# Patient Record
Sex: Female | Born: 1980 | Race: White | Hispanic: No | Marital: Married | Smoking: Never smoker
Health system: Southern US, Community
[De-identification: ages and names within clinical notes are randomized; demographics above are authoritative.]

## PROBLEM LIST (undated history)

## (undated) ENCOUNTER — Inpatient Hospital Stay (HOSPITAL_COMMUNITY): Payer: Self-pay

## (undated) DIAGNOSIS — T8859XA Other complications of anesthesia, initial encounter: Secondary | ICD-10-CM

## (undated) DIAGNOSIS — I10 Essential (primary) hypertension: Secondary | ICD-10-CM

## (undated) DIAGNOSIS — IMO0002 Reserved for concepts with insufficient information to code with codable children: Secondary | ICD-10-CM

## (undated) DIAGNOSIS — F32A Depression, unspecified: Secondary | ICD-10-CM

## (undated) DIAGNOSIS — M329 Systemic lupus erythematosus, unspecified: Secondary | ICD-10-CM

## (undated) DIAGNOSIS — T4145XA Adverse effect of unspecified anesthetic, initial encounter: Secondary | ICD-10-CM

## (undated) DIAGNOSIS — F329 Major depressive disorder, single episode, unspecified: Secondary | ICD-10-CM

## (undated) HISTORY — DX: Essential (primary) hypertension: I10

---
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
## 2000-07-04 ENCOUNTER — Other Ambulatory Visit: Admission: RE | Admit: 2000-07-04 | Discharge: 2000-07-04 | Payer: Self-pay | Admitting: Obstetrics and Gynecology

## 2001-08-05 ENCOUNTER — Other Ambulatory Visit: Admission: RE | Admit: 2001-08-05 | Discharge: 2001-08-05 | Payer: Self-pay | Admitting: Obstetrics and Gynecology

## 2001-08-24 ENCOUNTER — Emergency Department (HOSPITAL_COMMUNITY): Admission: EM | Admit: 2001-08-24 | Discharge: 2001-08-24 | Payer: Self-pay | Admitting: *Deleted

## 2002-08-08 ENCOUNTER — Other Ambulatory Visit: Admission: RE | Admit: 2002-08-08 | Discharge: 2002-08-08 | Payer: Self-pay | Admitting: Obstetrics and Gynecology

## 2003-08-26 ENCOUNTER — Other Ambulatory Visit: Admission: RE | Admit: 2003-08-26 | Discharge: 2003-08-26 | Payer: Self-pay | Admitting: Obstetrics and Gynecology

## 2004-08-30 ENCOUNTER — Other Ambulatory Visit: Admission: RE | Admit: 2004-08-30 | Discharge: 2004-08-30 | Payer: Self-pay | Admitting: Obstetrics and Gynecology

## 2008-04-13 ENCOUNTER — Inpatient Hospital Stay (HOSPITAL_COMMUNITY): Admission: AD | Admit: 2008-04-13 | Discharge: 2008-04-14 | Payer: Self-pay | Admitting: Obstetrics and Gynecology

## 2008-04-14 ENCOUNTER — Encounter (INDEPENDENT_AMBULATORY_CARE_PROVIDER_SITE_OTHER): Payer: Self-pay | Admitting: Obstetrics and Gynecology

## 2008-06-25 ENCOUNTER — Ambulatory Visit (HOSPITAL_COMMUNITY): Admission: RE | Admit: 2008-06-25 | Discharge: 2008-06-25 | Payer: Self-pay | Admitting: Obstetrics and Gynecology

## 2008-06-25 ENCOUNTER — Encounter (INDEPENDENT_AMBULATORY_CARE_PROVIDER_SITE_OTHER): Payer: Self-pay | Admitting: Obstetrics and Gynecology

## 2008-09-10 ENCOUNTER — Inpatient Hospital Stay (HOSPITAL_COMMUNITY): Admission: RE | Admit: 2008-09-10 | Discharge: 2008-09-10 | Payer: Self-pay | Admitting: Obstetrics and Gynecology

## 2009-04-02 ENCOUNTER — Inpatient Hospital Stay (HOSPITAL_COMMUNITY): Admission: AD | Admit: 2009-04-02 | Discharge: 2009-04-03 | Payer: Self-pay | Admitting: Obstetrics and Gynecology

## 2009-04-13 ENCOUNTER — Inpatient Hospital Stay (HOSPITAL_COMMUNITY): Admission: AD | Admit: 2009-04-13 | Discharge: 2009-04-13 | Payer: Self-pay | Admitting: Obstetrics and Gynecology

## 2009-04-19 ENCOUNTER — Inpatient Hospital Stay (HOSPITAL_COMMUNITY): Admission: RE | Admit: 2009-04-19 | Discharge: 2009-04-22 | Payer: Self-pay | Admitting: Obstetrics and Gynecology

## 2009-04-23 ENCOUNTER — Encounter: Admission: RE | Admit: 2009-04-23 | Discharge: 2009-05-20 | Payer: Self-pay | Admitting: Obstetrics and Gynecology

## 2010-05-02 LAB — CBC
HCT: 41.4 % (ref 36.0–46.0)
HCT: 42.9 % (ref 36.0–46.0)
Hemoglobin: 13.8 g/dL (ref 12.0–15.0)
MCHC: 33.2 g/dL (ref 30.0–36.0)
MCHC: 34.3 g/dL (ref 30.0–36.0)
MCV: 91.5 fL (ref 78.0–100.0)
MCV: 91.8 fL (ref 78.0–100.0)
Platelets: 104 10*3/uL — ABNORMAL LOW (ref 150–400)
Platelets: 164 10*3/uL (ref 150–400)
Platelets: 169 10*3/uL (ref 150–400)
RBC: 4.51 MIL/uL (ref 3.87–5.11)
RDW: 15.3 % (ref 11.5–15.5)
RDW: 15.4 % (ref 11.5–15.5)
RDW: 15.5 % (ref 11.5–15.5)
WBC: 8.5 10*3/uL (ref 4.0–10.5)

## 2010-05-02 LAB — COMPREHENSIVE METABOLIC PANEL
ALT: 30 U/L (ref 0–35)
AST: 30 U/L (ref 0–37)
Albumin: 2.7 g/dL — ABNORMAL LOW (ref 3.5–5.2)
Alkaline Phosphatase: 191 U/L — ABNORMAL HIGH (ref 39–117)
BUN: 9 mg/dL (ref 6–23)
CO2: 22 mEq/L (ref 19–32)
Calcium: 9 mg/dL (ref 8.4–10.5)
Chloride: 104 mEq/L (ref 96–112)
Creatinine, Ser: 0.74 mg/dL (ref 0.4–1.2)
GFR calc Af Amer: >60 mL/min (ref >60)
GFR calc non Af Amer: >60 mL/min (ref >60)
Glucose, Bld: 75 mg/dL (ref 70–99)
Potassium: 4.4 mEq/L (ref 3.5–5.1)
Sodium: 133 mEq/L — ABNORMAL LOW (ref 135–145)
Total Bilirubin: 0.4 mg/dL (ref 0.3–1.2)
Total Protein: 5.7 g/dL — ABNORMAL LOW (ref 6.0–8.3)

## 2010-05-14 LAB — URINALYSIS, ROUTINE W REFLEX MICROSCOPIC
Protein, ur: 30 mg/dL — AB
Urobilinogen, UA: 1 mg/dL (ref 0.0–1.0)

## 2010-05-14 LAB — URINE MICROSCOPIC-ADD ON

## 2010-05-14 LAB — URINE CULTURE: Colony Count: 60000

## 2010-05-17 LAB — HCG, SERUM, QUALITATIVE: Preg, Serum: NEGATIVE

## 2010-05-17 LAB — CBC
MCHC: 33.9 g/dL (ref 30.0–36.0)
RBC: 4.99 MIL/uL (ref 3.87–5.11)

## 2010-05-19 LAB — CBC
HCT: 37.6 % (ref 36.0–46.0)
MCHC: 33.8 g/dL (ref 30.0–36.0)
MCHC: 34 g/dL (ref 30.0–36.0)
Platelets: 204 10*3/uL (ref 150–400)
Platelets: 211 10*3/uL (ref 150–400)
RBC: 3.64 MIL/uL — ABNORMAL LOW (ref 3.87–5.11)
RDW: 13.6 % (ref 11.5–15.5)
RDW: 13.6 % (ref 11.5–15.5)
WBC: 18.5 10*3/uL — ABNORMAL HIGH (ref 4.0–10.5)

## 2010-05-19 LAB — RH IMMUNE GLOBULIN WORKUP (NOT WOMEN'S HOSP)
ABO/RH(D): A NEG
Antibody Screen: POSITIVE

## 2010-05-19 LAB — TYPE AND SCREEN: ABO/RH(D): A NEG

## 2010-06-21 NOTE — Op Note (Signed)
NAMEMarland Kitchen  ECHO, PROPP        ACCOUNT NO.:  [id number]   MEDICAL RECORD NO.:  [id number]          PATIENT TYPE:  AMB   LOCATION:  [facility]                           FACILITY:  [facility]   PHYSICIAN:  Juluis Mire, M.D.   DATE OF BIRTH:  02/29/1980   DATE OF PROCEDURE:  06/25/2008  DATE OF DISCHARGE:  06/25/2008                               OPERATIVE REPORT   PREOPERATIVE DIAGNOSIS:  Possible uterine septum or anomaly.   POSTOPERATIVE DIAGNOSIS:  There was no evidence of uterine septum or  anomaly.   PROCEDURE:  Hysteroscopy with dilation and curettage.   SURGEON:  Juluis Mire, MD   ANESTHESIA:  General.   ESTIMATED BLOOD LOSS:  Minimal.   PACKS AND DRAINS:  None.   INTRAOPERATIVE BLOOD PLACED:  None.   COMPLICATIONS:  None.   INDICATIONS:  As dictated in history and physical.   PROCEDURE:  The patient was taken to the OR, placed in supine position.  After satisfactory level of general endotracheal anesthesia was  obtained, the patient was placed in the dorsal lithotomy position using  the Allen stirrups.  The abdomen, perineum, vagina were prepped out with  Betadine and draped with sterile field.  Speculum was placed in the  vaginal vault.  Cervix grasped with single-tooth tenaculum.  A  paracervical blocks 1% Xylocaine was instituted.  Uterus sounded to 9  cm.  Cervix was dilated to a size 27 Pratt dilator.  A diagnostic  hysteroscope was introduced.  Intrauterine cavity was distended using  sorbitol.  Visualization revealed some thickened endometrium, but there  was absolutely no evidence of septum or any type of uterine anomalies.  When and put the resectoscope and to get better distention.  Visualization again revealed no evidence of any uterine anomalies.  We  did do endometrial curettings they were sent for pathology.  This point  in time, the hysteroscope was removed.  Total deficit was 200 mL.  Single-tooth speculum removed.  The patient taken out of dorsal  lithotomy position, once alert and extubated, transferred to recovery  room in good condition.  Sponge, instrument, and needle count was  correct by circulating nurse.     Juluis Mire, M.D.  Electronically Signed    JSM/MEDQ  D:  06/25/2008  T:  06/26/2008  Job:  [id number]

## 2010-06-21 NOTE — H&P (Signed)
NAME:  Kendra Jacobson, Kendra Jacobson        ACCOUNT NO.:  [id number]   MEDICAL RECORD NO.:  [id number]          PATIENT TYPE:  AMB   LOCATION:  [facility]C                           [facility]:  [facility]   PHYSICIAN:  Juluis Mire, M.D.   DATE OF BIRTH:  [birth year]   DATE OF ADMISSION:  06/25/2008  DATE OF DISCHARGE:                              HISTORY & PHYSICAL   The patient is a 30 year old gravida 1, para 0, abortus 2 female who  presents for hysteroscopy.   The patient had 15 weeks second trimester pregnancy loss.  Because of  this, we did some evaluation of the uterine cavity with saline infusion  ultrasound.  The question of uterine septum or arcuate uterus came into  play.  Because of this, we are going to proceed with hysteroscopic  evaluation and possible resection.  May want to consider laparoscopy at  the same time.   ALLERGIES:  In terms of allergies, the patient is allergic to  ERYTHROMYCIN.   MEDICATIONS:  Prenatal vitamins.   PAST MEDICAL HISTORY:  Usual childhood diseases.  No significant  sequelae.   PAST SURGICAL HISTORY:  She has had a tonsillectomy, also had a D&C with  this pregnancy loss.   SOCIAL HISTORY:  There is no tobacco or alcohol use.   FAMILY HISTORY:  There is a history of chronic hypertension.  Also a  history of deep venous thrombosis.  Her mother has autoimmune hepatitis.  Father with history of diabetes.   REVIEW OF SYSTEMS:  Noncontributory.   PHYSICAL EXAMINATION:  VITAL SIGNS:  The patient is afebrile with stable  vital signs.  HEENT:  The patient is normocephalic.  Pupils are equal, round, and  reactive to light and accommodation.  Extraocular movements are intact.  Sclerae and conjunctivae are clear.  Oropharynx clear.  NECK:  Without thyromegaly.  BREASTS:  No discrete masses.  LUNGS:  Clear.  CARDIOVASCULAR:  Regular rhythm and rate without murmurs or gallops.  ABDOMEN:  Benign.  No mass, organomegaly, or tenderness.  PELVIC:  Normal external  genitalia.  Vaginal mucosa is clear.  Cervix  unremarkable.  Uterus normal size, shape and contour.  Adnexa free of  masses or tenderness.  EXTREMITIES:  Trace edema.  NEUROLOGIC:  Grossly with normal limits.   IMPRESSION:  Second trimester pregnancy loss with possibility of uterine  septum.   PLAN:  The patient to undergo hysteroscopy for evaluation of septum.  The nature of the procedure have been discussed.  The risks explained  including the risk of infection.  The risk of hemorrhage that could  require transfusion  with the risk of AIDS or hepatitis, risk of injury to adjacent organs  including bladder, bowel or ureters could require further exploratory  surgery.  Risk of deep venous thrombosis and pulmonary embolus.  The  patient expressed understand potential risks and indications.      Juluis Mire, M.D.  Electronically Signed     JSM/MEDQ  D:  06/25/2008  T:  06/25/2008  Job:  [id number]

## 2010-06-21 NOTE — Op Note (Signed)
Kendra Jacobson, Kendra Jacobson        ACCOUNT NO.:  [id number]   MEDICAL RECORD NO.:  [id number]          PATIENT TYPE:  INP   LOCATION:  9151                          FACILITY:  [facility]   PHYSICIAN:  Duke Salvia. Marcelle Overlie, M.D.DATE OF BIRTH:  1981/01/10   DATE OF PROCEDURE:  04/14/2008  DATE OF DISCHARGE:                               OPERATIVE REPORT   PREOPERATIVE DIAGNOSIS:  A 15 and 1/2 week spontaneous abortion,  incomplete abortion.   POSTOPERATIVE DIAGNOSIS:  A 15 and 1/2 week spontaneous abortion,  incomplete abortion.   PROCEDURE:  Dilatation and curettage.   SURGEON:  Duke Salvia. Marcelle Overlie, MD   ANESTHESIA:  General.   COMPLICATIONS:  None.   DRAINS:  Foley catheter.   ESTIMATED BLOOD LOSS:  350 mL mainly old blood.   SPECIMENS REMOVED:  Products of conception, placenta.   PROCEDURE AND FINDINGS:  The patient taken to the operating room after  an adequate level of general anesthesia was obtained with the patient's  legs in stirrups.  Perineum and vagina prepped and draped with Betadine.  Foley catheter was already positioned.  Weighted speculum was  positioned.  The placental edge could be seen at the os, was grasped  with a ring forceps, and removed in one large section.  The anterior  cervical lip was then grasped with a ring forceps, a large curette was  then used to gently curette the cavity revealing some minor tissue  fragments, but the walls were cleaned at that point, minimal bleeding.  She tolerated this well, went to recovery room in good condition.      [name], M.D.  Electronically Signed     RMH/MEDQ  D:  04/14/2008  T:  04/14/2008  Job:  [id number]

## 2010-06-21 NOTE — H&P (Signed)
Kendra Jacobson, Kendra Jacobson        ACCOUNT NO.:  [id number]   MEDICAL RECORD NO.:  [id number]          PATIENT TYPE:  INP   LOCATION:                                [facility]ILITY:  [facility]   PHYSICIAN:  Duke Salvia. Marcelle Jacobson, M.D.DATE OF BIRTH:  November 25, 1980   DATE OF ADMISSION:  04/13/2008  DATE OF DISCHARGE:                              HISTORY & PHYSICAL   CHIEF COMPLAINT:  Spotting.  Pelvic pressure.   HISTORY OF PRESENT ILLNESS:  A 30 year old G1, P0, EGD October 04, 2008  currently 15 weeks.  This patient began to have some spotting 3 days  ago.  She was told to rest over the weekend and called the office this  morning.  Was brought in from ultrasound today complaining of still some  spotting and increased pelvic pressure.  Her first trimester screen and  AFP only returned normal.   Ultrasound today showed good fetal movement, [redacted] weeks gestation, a 3.0 x  12.8 x 2 cm [facility] with cervical length of 1 cm.  She is admitted at this  time for further observation and reevaluation with possible cerclage.   PAST MEDICAL HISTORY:   ALLERGIES:  ERYTHROMYCIN.   Please see her [facility] form for the remainder of her past medical  history.  Blood type is A negative.  She did receive a dose of RhoGAM  March 10, 2008 when she had some early pregnancy spotting.   PHYSICAL EXAM:  VITAL SIGNS:  Temperature 98.2, blood pressure 120/78,  afebrile.  HEENT:  Unremarkable.  NECK:  Supple without mass.  LUNGS:  Clear.  CARDIOVASCULAR:  Regular rate and rhythm without murmurs, rubs, or  gallops.  BREASTS:  Not examined.  ABDOMEN:  Benign.  Fundus is soft, nontender.  Fetal heart rate 140.  GENITOURINARY:  Cervix by palpation was closed.  Bulging lower uterine  segment.  No blood noted.   IMPRESSION:  1. A 15-week pregnancy.  2. Subchorionic hemorrhage.  3. Thin cervix, possible incompetent cervix.   PLAN:  Will admit for bed rest, evaluation, IV antibiotics, and repeat  ultrasound in the morning for  possible cerclage.      Kendra Jacobson, M.D.  Electronically Signed     RMH/MEDQ  D:  04/13/2008  T:  04/13/2008  Job:  [id number]

## 2011-10-30 ENCOUNTER — Other Ambulatory Visit: Payer: Self-pay | Admitting: Internal Medicine

## 2011-10-30 DIAGNOSIS — R748 Abnormal levels of other serum enzymes: Secondary | ICD-10-CM

## 2011-11-07 ENCOUNTER — Ambulatory Visit
Admission: RE | Admit: 2011-11-07 | Discharge: 2011-11-07 | Disposition: A | Discharge status: Home / Self Care | Payer: BC Managed Care – PPO | Source: Ambulatory Visit | Attending: Internal Medicine | Admitting: Internal Medicine

## 2011-11-07 DIAGNOSIS — R748 Abnormal levels of other serum enzymes: Secondary | ICD-10-CM

## 2012-07-17 ENCOUNTER — Ambulatory Visit: Payer: Self-pay | Admitting: Nurse Practitioner

## 2012-09-12 ENCOUNTER — Encounter (HOSPITAL_COMMUNITY): Payer: Self-pay | Admitting: Pharmacy Technician

## 2012-09-12 ENCOUNTER — Encounter (INDEPENDENT_AMBULATORY_CARE_PROVIDER_SITE_OTHER): Payer: Self-pay | Admitting: General Surgery

## 2012-09-12 ENCOUNTER — Ambulatory Visit (INDEPENDENT_AMBULATORY_CARE_PROVIDER_SITE_OTHER): Payer: BC Managed Care – PPO | Admitting: General Surgery

## 2012-09-12 VITALS — BP 118/62 | HR 89 | Temp 98.3°F | Resp 18 | Ht 65.0 in | Wt 162.0 lb

## 2012-09-12 DIAGNOSIS — IMO0001 Reserved for inherently not codable concepts without codable children: Secondary | ICD-10-CM

## 2012-09-12 NOTE — Progress Notes (Signed)
Patient ID: Kendra Jacobson, female   DOB: 10/01/1980, 32 y.o.   MRN: [id number]  Chief Complaint  Patient presents with  . New Evaluation    HPI Kendra Jacobson is a 32 y.o. female. This patient is referred by Dr. Beekman for possible muscle biopsy of her thigh. She has a history of lupus and is treated with hydroxy chloroquine for this but she has had ongoing lack of energy and muscle and joint weakness and myalgias. She's also had numbness in both of her hands with negative nerve studies. She has lack of energy and feels sore all over the place with migrating soreness. She says that is most always in her thighs bilaterally and will occasionally be in her arms as well. She was referred for muscle biopsy of her thigh due to 2 elevated CK levelswith her most recent CK level being 1233. She has taken prednisone off and on which seems to significantly improve her symptoms but she has been off of prednisone now for about 6 weeks and has continued symptoms. HPI  No past medical history on file.  Past Surgical History  Procedure Laterality Date  . Tonsillectomy  1999  . Cesarean section  04/2009  . Wisdom tooth extraction  2000    x4    Family History  Problem Relation Age of Onset  . Asthma Mother   . Hypertension Mother   . Parkinson's disease Father   . Hyperlipidemia Father     Social History History  Substance Use Topics  . Smoking status: Not on file  . Smokeless tobacco: Not on file  . Alcohol Use: Not on file    Allergies  Allergen Reactions  . Erythromycin     Current Outpatient Prescriptions  Medication Sig Dispense Refill  . DULoxetine (CYMBALTA) 60 MG capsule Take 60 mg by mouth daily.      . hydroxychloroquine (PLAQUENIL) 200 MG tablet Take by mouth daily.      . hydroxychloroquine (PLAQUENIL) 200 MG tablet Take by mouth daily.      . loratadine (CLARITIN) 10 MG tablet Take 10 mg by mouth daily.      . Prenatal Vit-Fe Fumarate-FA (PRENATAL  MULTIVITAMIN) TABS tablet Take 1 tablet by mouth daily at 12 noon.       No current facility-administered medications for this visit.    Review of Systems Review of Systems All other review of systems negative or noncontributory except as stated in the HPI  Blood pressure 118/62, pulse 89, temperature 98.3 F (36.8 C), resp. rate 18, height 5' 5" (1.651 m), weight 162 lb (73.483 kg).  Physical Exam Physical Exam Physical Exam  Nursing note and vitals reviewed. Constitutional: She is oriented to person, place, and time. She appears well-developed and well-nourished. No distress.  HENT:  Head: Normocephalic and atraumatic.  Mouth/Throat: No oropharyngeal exudate.  Eyes: Conjunctivae and EOM are normal. Pupils are equal, round, and reactive to light. Right eye exhibits no discharge. Left eye exhibits no discharge. No scleral icterus.  Neck: Normal range of motion. Neck supple. No tracheal deviation present.  Cardiovascular: Normal rate, regular rhythm, normal heart sounds and intact distal pulses.   Pulmonary/Chest: Effort normal and breath sounds normal. No stridor. No respiratory distress. She has no wheezes.  Abdominal: Soft. Bowel sounds are normal. She exhibits no distension and no mass. There is no tenderness. There is no rebound and no guarding.  Musculoskeletal: Normal range of motion. She exhibits no edema and no tenderness.  Neurological: She   is alert and oriented to person, place, and time.  Skin: Skin is warm and dry. No rash noted. She is not diaphoretic. No erythema. No pallor.  Psychiatric: She has a normal mood and affect. Her behavior is normal. Judgment and thought content normal.    Data Reviewed Labs and outpatient records  Assessment    Myalgias and myositis She has myalgias and lack of energy and is currently under going workup by her rheumatologist. He has recommended muscle biopsy to evaluate for possible cause of her elevated CK levels. I discussed with the  patient the option of thigh muscle biopsy as requested by her rheumatologist versus ongoing observation. She would like to proceed with muscle biopsy. We discussed the pros and cons of the procedure and the risks of the procedure including infection, bleeding, persistent symptoms, false positive or false-negative result, insufficient tissue, and nerve injury and pain and she states understanding and would like to undergo thigh muscle biopsy    Plan    We will set her up for muscle biopsy when convenient.        [name] DAVID 09/12/2012, 9:55 AM    

## 2012-09-13 ENCOUNTER — Telehealth (INDEPENDENT_AMBULATORY_CARE_PROVIDER_SITE_OTHER): Payer: Self-pay

## 2012-09-13 NOTE — Telephone Encounter (Signed)
Called Dr. Shawnee Knapp office [phone] to see which side the physician would like the patient to have a muscle biopsy performed by Dr. Biagio Quint.  Will await a call from Dr. Dierdre Forth or his nurse.

## 2012-09-13 NOTE — Telephone Encounter (Signed)
Incoming call from Dr. Shawnee Knapp office regarding muscle biopsy.  Per Dr. Dierdre Forth "No preference on sides, just quadricep muscle biopsy"  Message verbally given to Dr. Biagio Quint.

## 2012-09-13 NOTE — Patient Instructions (Addendum)
20 Kendra Jacobson  09/13/2012   Your procedure is scheduled on: 09/18/12  Report to [facility] at 6:00 AM  Call this number if you have problems the morning of surgery [phone]   Remember:   Do not eat food or drink liquids After Midnight.     Do not wear jewelry, make-up or nail polish.  Do not wear lotions, powders, or perfumes. You may wear deodorant.  Do not shave 48 hours prior to surgery. Men may shave face and neck.  Do not bring valuables to the hospital.  Contacts, dentures or bridgework may not be worn into surgery.     Patients discharged the day of surgery will not be allowed to drive home.  Name and phone number of your driver: [name] [phone]   Birdie Sons, RN  pre op nurse call if needed [phone]    FAILURE TO FOLLOW THESE INSTRUCTIONS MAY RESULT IN CANCELLATION OF YOUR SURGERY   Patient Signature: ___________________________________________

## 2012-09-16 ENCOUNTER — Encounter (HOSPITAL_COMMUNITY): Payer: Self-pay

## 2012-09-16 ENCOUNTER — Encounter (HOSPITAL_COMMUNITY)
Admission: RE | Admit: 2012-09-16 | Discharge: 2012-09-16 | Disposition: A | Discharge status: Home / Self Care | Payer: BC Managed Care – PPO | Source: Ambulatory Visit | Attending: General Surgery | Admitting: General Surgery

## 2012-09-16 VITALS — BP 113/76 | HR 82 | Temp 97.9°F | Resp 18 | Ht 64.0 in | Wt 162.0 lb

## 2012-09-16 DIAGNOSIS — IMO0001 Reserved for inherently not codable concepts without codable children: Secondary | ICD-10-CM

## 2012-09-16 HISTORY — DX: Other complications of anesthesia, initial encounter: T88.59XA

## 2012-09-16 HISTORY — DX: Adverse effect of unspecified anesthetic, initial encounter: T41.45XA

## 2012-09-16 HISTORY — DX: Reserved for concepts with insufficient information to code with codable children: IMO0002

## 2012-09-16 HISTORY — DX: Depression, unspecified: F32.A

## 2012-09-16 HISTORY — DX: Systemic lupus erythematosus, unspecified: M32.9

## 2012-09-16 HISTORY — DX: Major depressive disorder, single episode, unspecified: F32.9

## 2012-09-16 LAB — BASIC METABOLIC PANEL
BUN: 9 mg/dL (ref 6–23)
Chloride: 102 mEq/L (ref 96–112)
Creatinine, Ser: 0.6 mg/dL (ref 0.50–1.10)
Glucose, Bld: 96 mg/dL (ref 70–99)
Potassium: 3.9 mEq/L (ref 3.5–5.1)

## 2012-09-16 LAB — CBC
HCT: 41 % (ref 36.0–46.0)
Hemoglobin: 13.8 g/dL (ref 12.0–15.0)
MCH: 29.9 pg (ref 26.0–34.0)
MCHC: 33.7 g/dL (ref 30.0–36.0)
MCV: 88.9 fL (ref 78.0–100.0)

## 2012-09-17 NOTE — Anesthesia Preprocedure Evaluation (Addendum)
Anesthesia Evaluation  Patient identified by MRN, date of birth, ID band Patient awake    Reviewed: Allergy & Precautions, H&P , NPO status , Patient's Chart, lab work & pertinent test results  History of Anesthesia Complications Negative for: history of anesthetic complications  Airway Mallampati: II TM Distance: >3 FB Neck ROM: Full    Dental  (+) Dental Advisory Given and Teeth Intact   Pulmonary neg pulmonary ROS,          Cardiovascular negative cardio ROS      Neuro/Psych PSYCHIATRIC DISORDERS Depression negative neurological ROS     GI/Hepatic negative GI ROS, Neg liver ROS,   Endo/Other  negative endocrine ROS  Renal/GU negative Renal ROS     Musculoskeletal negative musculoskeletal ROS (+)   Abdominal   Peds  Hematology negative hematology ROS (+)   Anesthesia Other Findings   Reproductive/Obstetrics negative OB ROS                          Anesthesia Physical Anesthesia Plan  ASA: II  Anesthesia Plan: MAC and General   Post-op Pain Management:    Induction: Intravenous  Airway Management Planned: LMA, Mask and Simple Face Mask  Additional Equipment:   Intra-op Plan:   Post-operative Plan: Extubation in OR  Informed Consent: I have reviewed the patients History and Physical, chart, labs and discussed the procedure including the risks, benefits and alternatives for the proposed anesthesia with the patient or authorized representative who has indicated his/her understanding and acceptance.   Dental advisory given  Plan Discussed with: CRNA  Anesthesia Plan Comments:        Anesthesia Quick Evaluation

## 2012-09-18 ENCOUNTER — Encounter (HOSPITAL_COMMUNITY)
Admission: RE | Disposition: A | Discharge status: Home / Self Care | Payer: Self-pay | Source: Ambulatory Visit | Attending: General Surgery

## 2012-09-18 ENCOUNTER — Encounter (HOSPITAL_COMMUNITY): Payer: Self-pay | Admitting: Anesthesiology

## 2012-09-18 ENCOUNTER — Telehealth (INDEPENDENT_AMBULATORY_CARE_PROVIDER_SITE_OTHER): Payer: Self-pay

## 2012-09-18 ENCOUNTER — Encounter (HOSPITAL_COMMUNITY): Payer: Self-pay | Admitting: *Deleted

## 2012-09-18 ENCOUNTER — Ambulatory Visit (HOSPITAL_COMMUNITY): Payer: BC Managed Care – PPO | Admitting: Anesthesiology

## 2012-09-18 ENCOUNTER — Ambulatory Visit (HOSPITAL_COMMUNITY)
Admission: RE | Admit: 2012-09-18 | Discharge: 2012-09-18 | Disposition: A | Discharge status: Home / Self Care | Payer: BC Managed Care – PPO | Source: Ambulatory Visit | Attending: General Surgery | Admitting: General Surgery

## 2012-09-18 DIAGNOSIS — IMO0001 Reserved for inherently not codable concepts without codable children: Secondary | ICD-10-CM

## 2012-09-18 DIAGNOSIS — Z01812 Encounter for preprocedural laboratory examination: Secondary | ICD-10-CM | POA: Insufficient documentation

## 2012-09-18 DIAGNOSIS — R531 Weakness: Secondary | ICD-10-CM

## 2012-09-18 DIAGNOSIS — M329 Systemic lupus erythematosus, unspecified: Secondary | ICD-10-CM | POA: Insufficient documentation

## 2012-09-18 DIAGNOSIS — G7249 Other inflammatory and immune myopathies, not elsewhere classified: Secondary | ICD-10-CM | POA: Insufficient documentation

## 2012-09-18 DIAGNOSIS — Z79899 Other long term (current) drug therapy: Secondary | ICD-10-CM | POA: Insufficient documentation

## 2012-09-18 DIAGNOSIS — M6281 Muscle weakness (generalized): Secondary | ICD-10-CM

## 2012-09-18 HISTORY — PX: MUSCLE BIOPSY: SHX716

## 2012-09-18 SURGERY — MUSCLE BIOPSY
Anesthesia: Monitor Anesthesia Care | Site: Thigh | Laterality: Left | Wound class: Clean

## 2012-09-18 MED ORDER — PROMETHAZINE HCL 25 MG/ML IJ SOLN: 6.2500 mg | INTRAMUSCULAR | Status: DC | PRN

## 2012-09-18 MED ORDER — OXYCODONE HCL 5 MG/5ML PO SOLN
5.0000 mg | Freq: Once | ORAL | Status: DC | PRN
  Filled 2012-09-18: qty 5

## 2012-09-18 MED ORDER — CEFAZOLIN SODIUM-DEXTROSE 2-3 GM-% IV SOLR
INTRAVENOUS | Status: DC | PRN
  Administered 2012-09-18: 2 g via INTRAVENOUS

## 2012-09-18 MED ORDER — DEXAMETHASONE SODIUM PHOSPHATE 10 MG/ML IJ SOLN
INTRAMUSCULAR | Status: DC | PRN
  Administered 2012-09-18: 10 mg via INTRAVENOUS

## 2012-09-18 MED ORDER — LIDOCAINE-EPINEPHRINE (PF) 1 %-1:200000 IJ SOLN
INTRAMUSCULAR | Status: DC | PRN
  Administered 2012-09-18: 20 mL via INTRADERMAL

## 2012-09-18 MED ORDER — PROPOFOL INFUSION 10 MG/ML OPTIME
INTRAVENOUS | Status: DC | PRN
  Administered 2012-09-18: 75 ug/kg/min via INTRAVENOUS

## 2012-09-18 MED ORDER — OXYCODONE HCL 5 MG PO TABS: 5.0000 mg | ORAL_TABLET | Freq: Once | ORAL | Status: DC | PRN

## 2012-09-18 MED ORDER — HYDROCODONE-ACETAMINOPHEN 5-325 MG PO TABS: 1.0000 | ORAL_TABLET | ORAL | Status: DC | PRN

## 2012-09-18 MED ORDER — BUPIVACAINE HCL (PF) 0.25 % IJ SOLN
INTRAMUSCULAR | Status: AC
  Filled 2012-09-18: qty 30

## 2012-09-18 MED ORDER — MEPERIDINE HCL 50 MG/ML IJ SOLN: 6.2500 mg | INTRAMUSCULAR | Status: DC | PRN

## 2012-09-18 MED ORDER — ONDANSETRON HCL 4 MG/2ML IJ SOLN
INTRAMUSCULAR | Status: DC | PRN
  Administered 2012-09-18: 4 mg via INTRAVENOUS

## 2012-09-18 MED ORDER — LIDOCAINE-EPINEPHRINE 1 %-1:100000 IJ SOLN
INTRAMUSCULAR | Status: AC
  Filled 2012-09-18: qty 1

## 2012-09-18 MED ORDER — BUPIVACAINE HCL (PF) 0.25 % IJ SOLN
INTRAMUSCULAR | Status: DC | PRN
  Administered 2012-09-18: 20 mL

## 2012-09-18 MED ORDER — METOCLOPRAMIDE HCL 5 MG/ML IJ SOLN
INTRAMUSCULAR | Status: DC | PRN
  Administered 2012-09-18: 10 mg via INTRAVENOUS

## 2012-09-18 MED ORDER — FENTANYL CITRATE 0.05 MG/ML IJ SOLN
INTRAMUSCULAR | Status: DC | PRN
  Administered 2012-09-18 (×2): 50 ug via INTRAVENOUS

## 2012-09-18 MED ORDER — LACTATED RINGERS IV SOLN
INTRAVENOUS | Status: DC | PRN
  Administered 2012-09-18 (×2): via INTRAVENOUS

## 2012-09-18 MED ORDER — CEFAZOLIN SODIUM-DEXTROSE 2-3 GM-% IV SOLR
INTRAVENOUS | Status: AC
  Filled 2012-09-18: qty 50

## 2012-09-18 MED ORDER — HYDROMORPHONE HCL PF 1 MG/ML IJ SOLN: 0.2500 mg | INTRAMUSCULAR | Status: DC | PRN

## 2012-09-18 MED ORDER — LIDOCAINE HCL 1 % IJ SOLN
INTRAMUSCULAR | Status: DC | PRN
  Administered 2012-09-18: 10 mL via INTRADERMAL

## 2012-09-18 MED ORDER — LIDOCAINE HCL 1 % IJ SOLN
INTRAMUSCULAR | Status: AC
  Filled 2012-09-18: qty 40

## 2012-09-18 MED ORDER — CEFAZOLIN SODIUM-DEXTROSE 2-3 GM-% IV SOLR: 2.0000 g | INTRAVENOUS | Status: DC

## 2012-09-18 MED ORDER — KETAMINE HCL 10 MG/ML IJ SOLN
INTRAMUSCULAR | Status: DC | PRN
  Administered 2012-09-18 (×2): 10 mg via INTRAVENOUS

## 2012-09-18 MED ORDER — MIDAZOLAM HCL 5 MG/5ML IJ SOLN
INTRAMUSCULAR | Status: DC | PRN
  Administered 2012-09-18 (×2): 1 mg via INTRAVENOUS

## 2012-09-18 SURGICAL SUPPLY — 37 items
BENZOIN TINCTURE PRP APPL 2/3 (GAUZE/BANDAGES/DRESSINGS) IMPLANT
BLADE HEX COATED 2.75 (ELECTRODE) ×2 IMPLANT
CLOTH BEACON ORANGE TIMEOUT ST (SAFETY) ×2 IMPLANT
CONT SPECI 4OZ STER CLIK (MISCELLANEOUS) ×2 IMPLANT
DERMABOND ADVANCED (GAUZE/BANDAGES/DRESSINGS) ×1
DERMABOND ADVANCED .7 DNX12 (GAUZE/BANDAGES/DRESSINGS) ×1 IMPLANT
DISSECTOR ROUND CHERRY 3/8 STR (MISCELLANEOUS) IMPLANT
DRAPE PED LAPAROTOMY (DRAPES) ×2 IMPLANT
ELECT REM PT RETURN 9FT ADLT (ELECTROSURGICAL) ×2
ELECTRODE REM PT RTRN 9FT ADLT (ELECTROSURGICAL) ×1 IMPLANT
GLOVE BIO SURGEON STRL SZ7 (GLOVE) ×2 IMPLANT
GLOVE BIOGEL PI IND STRL 7.5 (GLOVE) ×1 IMPLANT
GLOVE BIOGEL PI INDICATOR 7.5 (GLOVE) ×1
GLOVE SURG SS PI 7.5 STRL IVOR (GLOVE) ×4 IMPLANT
GOWN PREVENTION PLUS LG XLONG (DISPOSABLE) ×2 IMPLANT
GOWN STRL REIN XL XLG (GOWN DISPOSABLE) ×4 IMPLANT
HEMOSTAT SURGICEL 2X14 (HEMOSTASIS) IMPLANT
KIT BASIN OR (CUSTOM PROCEDURE TRAY) ×2 IMPLANT
MARKER SKIN DUAL TIP RULER LAB (MISCELLANEOUS) ×2 IMPLANT
NEEDLE HYPO 22GX1.5 SAFETY (NEEDLE) ×2 IMPLANT
NS IRRIG 1000ML POUR BTL (IV SOLUTION) ×2 IMPLANT
PACK BASIC VI WITH GOWN DISP (CUSTOM PROCEDURE TRAY) ×2 IMPLANT
PENCIL BUTTON HOLSTER BLD 10FT (ELECTRODE) ×2 IMPLANT
SPONGE GAUZE 4X4 12PLY (GAUZE/BANDAGES/DRESSINGS) IMPLANT
SPONGE LAP 4X18 X RAY DECT (DISPOSABLE) ×4 IMPLANT
STAPLER VISISTAT 35W (STAPLE) IMPLANT
STRIP CLOSURE SKIN 1/2X4 (GAUZE/BANDAGES/DRESSINGS) IMPLANT
SUT MNCRL AB 4-0 PS2 18 (SUTURE) ×2 IMPLANT
SUT VIC AB 3-0 SH 27 (SUTURE) ×2
SUT VIC AB 3-0 SH 27XBRD (SUTURE) ×2 IMPLANT
SUT VICRYL 3 0 BR 18  UND (SUTURE) ×1
SUT VICRYL 3 0 BR 18 UND (SUTURE) ×1 IMPLANT
SYR BULB IRRIGATION 50ML (SYRINGE) IMPLANT
SYR CONTROL 10ML LL (SYRINGE) ×2 IMPLANT
TOWEL OR 17X26 10 PK STRL BLUE (TOWEL DISPOSABLE) ×2 IMPLANT
WATER STERILE IRR 1500ML POUR (IV SOLUTION) IMPLANT
YANKAUER SUCT BULB TIP 10FT TU (MISCELLANEOUS) ×2 IMPLANT

## 2012-09-18 NOTE — Op Note (Signed)
NAMEMarland Kitchen  KATHRIN, FOLDEN        ACCOUNT NO.:  [id number]  MEDICAL RECORD NO.:  [id number]  LOCATION:  [facility]                         FACILITY:  [facility]  PHYSICIAN:  Lodema Pilot, MD       DATE OF BIRTH:  09-01-1980  DATE OF PROCEDURE:  09/18/2012 DATE OF DISCHARGE:  09/18/2012                              OPERATIVE REPORT   PROCEDURE:  Left thigh muscle biopsy.  PREOPERATIVE DIAGNOSIS:  Weakness and myositis.  POSTOPERATIVE DIAGNOSIS:  Weakness and myositis.  SURGEON:  Lodema Pilot, MD  ASSISTANT:  None.  ANESTHESIA:  Local anesthesia with moderate sedation.  FLUIDS:  1 L of crystalloid.  ESTIMATED BLOOD LOSS:  Minimal.  DRAINS:  None.  SPECIMENS:  Left thigh muscle biopsy sent to Pathology fresh.  COMPLICATIONS:  None apparent.  FINDINGS:  Normal-appearing muscle biopsy from the rectus femoris muscle, anterior left thigh.  INDICATION FOR PROCEDURE:  Ms. Berkey is a 32 year old female with generalized weakness and fatigue, who is followed by Dr. Dierdre Forth and he has requested a thigh muscle biopsy for further evaluation of her symptoms.  OPERATIVE DETAILS:  Ms. Francena Hanly was seen and evaluated in the preoperative area.  Risks and benefits of the procedure were again discussed in lay terms.  Informed consent was obtained.  I marked the area over the quadriceps muscle on the left thigh and anticipated area of incision with the patient prior to anesthetic administration.  She was given prophylactic antibiotics and taken to the operating room and placed on the table in supine position.  Moderate sedation was obtained and the left thigh was prepped and draped in the standard surgical fashion.  The procedure time-out was performed with all operative team members to confirm proper patient and procedure and I anesthetized the area of the incision with 1% lidocaine without epinephrine and carried the dissection down to the subcutaneous tissue.  I minimized  Bovie electrocautery and we identified the fascia overlying the rectus femoris muscle.  I sharply incised the fascia and identified the underlying muscle body.  I elevated several strands of muscular fibers from the body of the muscle and placed a hemostat on each end of the tissue and sharply divided the muscle fibers between the hemostats and this chunk tissue was sent to Pathology on saline moistened gauze.  The ends of the remaining muscle fibers were ligated with 3-0 Vicryl suture x2 and another additional specimen was taken in similar fashion.  Pathology want [place] to send some tissue for fat analysis.  This was performed in similar fashion.  I attempted to obtain a 2 cm segment of tissue by 2 cm x 1 cm segment of tissue with each biopsy.  The muscle was hemostatic and after the specimens were sent to Pathology, I injected  the wound with 30 mL of 1% lidocaine with epinephrine at this time mixed with 0.25% Marcaine plain in a 50:50 mixture.  Again the epinephrine was not used until the specimen was sent.  Hemostasis was obtained with Bovie electrocautery and the fascia was loosely approximated with 3-0 Vicryl suture.  The wound was again noted to be hemostatic and the dermis was approximated with interrupted 3-0 Vicryl sutures and the skin edges were approximated with 4-0  Monocryl subcuticular suture.  Skin was washed and dried and Dermabond was applied.  All sponge, needle, and instrument counts were correct at end of the case.  The patient tolerated the procedure well without apparent complication.  After the procedure, I went to Pathology personally to ensure that they received the specimen to their satisfaction.          ______________________________ Lodema Pilot, MD     BL/MEDQ  D:  09/18/2012  T:  09/18/2012  Job:  [id number]

## 2012-09-18 NOTE — H&P (View-Only) (Signed)
Patient ID: Kendra Jacobson, female   DOB: January 29, 1981, 32 y.o.   MRN: [id number]  Chief Complaint  Patient presents with  . New Evaluation    HPI Kendra Jacobson is a 32 y.o. female. This patient is referred by Dr. Dierdre Forth for possible muscle biopsy of her thigh. She has a history of lupus and is treated with hydroxy chloroquine for this but she has had ongoing lack of energy and muscle and joint weakness and myalgias. She's also had numbness in both of her hands with negative nerve studies. She has lack of energy and feels sore all over the place with migrating soreness. She says that is most always in her thighs bilaterally and will occasionally be in her arms as well. She was referred for muscle biopsy of her thigh due to 2 elevated CK levelswith her most recent CK level being 1233. She has taken prednisone off and on which seems to significantly improve her symptoms but she has been off of prednisone now for about 6 weeks and has continued symptoms. HPI  No past medical history on file.  Past Surgical History  Procedure Laterality Date  . Tonsillectomy  1999  . Cesarean section  04/2009  . Wisdom tooth extraction  2000    x4    Family History  Problem Relation Age of Onset  . Asthma Mother   . Hypertension Mother   . Parkinson's disease Father   . Hyperlipidemia Father     Social History History  Substance Use Topics  . Smoking status: Not on file  . Smokeless tobacco: Not on file  . Alcohol Use: Not on file    Allergies  Allergen Reactions  . Erythromycin     Current Outpatient Prescriptions  Medication Sig Dispense Refill  . DULoxetine (CYMBALTA) 60 MG capsule Take 60 mg by mouth daily.      . hydroxychloroquine (PLAQUENIL) 200 MG tablet Take by mouth daily.      . hydroxychloroquine (PLAQUENIL) 200 MG tablet Take by mouth daily.      Marland Kitchen loratadine (CLARITIN) 10 MG tablet Take 10 mg by mouth daily.      . Prenatal Vit-Fe Fumarate-FA (PRENATAL  MULTIVITAMIN) TABS tablet Take 1 tablet by mouth daily at 12 noon.       No current facility-administered medications for this visit.    Review of Systems Review of Systems All other review of systems negative or noncontributory except as stated in the HPI  Blood pressure 118/62, pulse 89, temperature 98.3 F (36.8 C), resp. rate 18, height 5\' 5"  (1.651 m), weight 162 lb (73.483 kg).  Physical Exam Physical Exam Physical Exam  Nursing note and vitals reviewed. Constitutional: She is oriented to person, place, and time. She appears well-developed and well-nourished. No distress.  HENT:  Head: Normocephalic and atraumatic.  Mouth/Throat: No oropharyngeal exudate.  Eyes: Conjunctivae and EOM are normal. Pupils are equal, round, and reactive to light. Right eye exhibits no discharge. Left eye exhibits no discharge. No scleral icterus.  Neck: Normal range of motion. Neck supple. No tracheal deviation present.  Cardiovascular: Normal rate, regular rhythm, normal heart sounds and intact distal pulses.   Pulmonary/Chest: Effort normal and breath sounds normal. No stridor. No respiratory distress. She has no wheezes.  Abdominal: Soft. Bowel sounds are normal. She exhibits no distension and no mass. There is no tenderness. There is no rebound and no guarding.  Musculoskeletal: Normal range of motion. She exhibits no edema and no tenderness.  Neurological: She  is alert and oriented to person, place, and time.  Skin: Skin is warm and dry. No rash noted. She is not diaphoretic. No erythema. No pallor.  Psychiatric: She has a normal mood and affect. Her behavior is normal. Judgment and thought content normal.    Data Reviewed Labs and outpatient records  Assessment    Myalgias and myositis She has myalgias and lack of energy and is currently under going workup by her rheumatologist. He has recommended muscle biopsy to evaluate for possible cause of her elevated CK levels. I discussed with the  patient the option of thigh muscle biopsy as requested by her rheumatologist versus ongoing observation. She would like to proceed with muscle biopsy. We discussed the pros and cons of the procedure and the risks of the procedure including infection, bleeding, persistent symptoms, false positive or false-negative result, insufficient tissue, and nerve injury and pain and she states understanding and would like to undergo thigh muscle biopsy    Plan    We will set her up for muscle biopsy when convenient.        Lodema Pilot DAVID 09/12/2012, 9:55 AM

## 2012-09-18 NOTE — Transfer of Care (Signed)
Immediate Anesthesia Transfer of Care Note  Patient: Kendra Jacobson  Procedure(s) Performed: Procedure(s): MUSCLE BIOPSY (Left)  Patient Location: PACU  Anesthesia Type:MAC  Level of Consciousness: awake, alert , oriented, patient cooperative and responds to stimulation  Airway & Oxygen Therapy: Patient Spontanous Breathing and Patient connected to face mask oxygen  Post-op Assessment: Report given to PACU RN, Post -op Vital signs reviewed and stable and Patient moving all extremities X 4  Post vital signs: Reviewed and stable  Complications: No apparent anesthesia complications

## 2012-09-18 NOTE — Brief Op Note (Signed)
09/18/2012  9:34 AM  PATIENT:  Kendra Jacobson  32 y.o. female  PRE-OPERATIVE DIAGNOSIS:  MYOSITIS   POST-OPERATIVE DIAGNOSIS:  * No post-op diagnosis entered *  PROCEDURE:  Procedure(s): MUSCLE BIOPSY (Left)  SURGEON:  Surgeon(s) and Role:    * Lodema Pilot, DO - Primary  PHYSICIAN ASSISTANT:   ASSISTANTS: none   ANESTHESIA:   IV sedation  EBL:  Total I/O In: 1000 [I.V.:1000] Out: 10 [Blood:10]  BLOOD ADMINISTERED:none  DRAINS: none   LOCAL MEDICATIONS USED:  MARCAINE    and LIDOCAINE   SPECIMEN:  Source of Specimen:  left thigh muscle biopsy  DISPOSITION OF SPECIMEN:  PATHOLOGY  COUNTS:  YES  TOURNIQUET:  * No tourniquets in log *  DICTATION: .Other Dictation: Dictation Number dictated  PLAN OF CARE: Discharge to home after PACU  PATIENT DISPOSITION:  PACU - hemodynamically stable.   Delay start of Pharmacological VTE agent (>24hrs) due to surgical blood loss or risk of bleeding: no

## 2012-09-18 NOTE — Anesthesia Postprocedure Evaluation (Signed)
Anesthesia Post Note  Patient: Kendra Jacobson  Procedure(s) Performed: Procedure(s) (LRB): MUSCLE BIOPSY (Left)  Anesthesia type: MAC  Patient location: PACU  Post pain: Pain level controlled  Post assessment: Post-op Vital signs reviewed  Last Vitals:  Filed Vitals:   09/18/12 0945  BP: 107/61  Pulse: 73  Temp:   Resp: 13    Post vital signs: Reviewed  Level of consciousness: sedated  Complications: No apparent anesthesia complications

## 2012-09-18 NOTE — Interval H&P Note (Signed)
History and Physical Interval Note:  09/18/2012 8:14 AM  Harrell Gave  has presented today for surgery, with the diagnosis of MYOSITIS   The various methods of treatment have been discussed with the patient and family. After consideration of risks, benefits and other options for treatment, the patient has consented to  Procedure(s): MUSCLE BIOPSY (N/A) as a surgical intervention .  The patient's history has been reviewed, patient examined, no change in status, stable for surgery.  I have reviewed the patient's chart and labs.  Questions were answered to the patient's satisfaction.  I have seen and evaluated this patient.  She denies any changes from prior visit.  Dr. Dierdre Forth said that he has no preference to side of biopsy but would like it from the quadriceps.  I have again discussed the procedure and the risks including infection, bleeding, pain, scarring, false negative or false positive biopsy, insufficient tissue and nerve injury and she expressed understanding and desires to proceed with left thigh muscle biopsy.  Site marked prior to anesthesia with patient participation.   Lodema Pilot DAVID

## 2012-09-18 NOTE — Telephone Encounter (Signed)
Late entry-- Per pathology request, office notes from Dr. Biagio Quint & Dr. Dierdre Forth along with CPT Code (16109) faxed to pathology.  Confirmation rec'd for outgoing fax.

## 2012-09-18 NOTE — Preoperative (Signed)
Beta Blockers   Reason not to administer Beta Blockers:Not Applicable, not on home BB 

## 2012-09-19 ENCOUNTER — Encounter (HOSPITAL_COMMUNITY): Payer: Self-pay | Admitting: General Surgery

## 2012-10-02 ENCOUNTER — Encounter (INDEPENDENT_AMBULATORY_CARE_PROVIDER_SITE_OTHER): Payer: Self-pay | Admitting: General Surgery

## 2012-10-02 ENCOUNTER — Ambulatory Visit (INDEPENDENT_AMBULATORY_CARE_PROVIDER_SITE_OTHER): Payer: BC Managed Care – PPO | Admitting: General Surgery

## 2012-10-02 VITALS — BP 126/76 | HR 78 | Temp 97.6°F | Resp 16 | Ht 64.0 in | Wt 162.4 lb

## 2012-10-02 DIAGNOSIS — Z4889 Encounter for other specified surgical aftercare: Secondary | ICD-10-CM

## 2012-10-02 DIAGNOSIS — Z5189 Encounter for other specified aftercare: Secondary | ICD-10-CM

## 2012-10-02 NOTE — Progress Notes (Signed)
Subjective:     Patient ID: Kendra Jacobson, female   DOB: Feb 26, 1980, 32 y.o.   MRN: [id number]  HPI This patient follows up status post left thigh muscle biopsy.  She is doing very well and has no complaints. She said it was sore for about 4 days but is no longer having any discomfort. 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 She did have some bruising around her incision but this is in the resolving stages.    Assessment:     Status post left thigh muscle biopsy-doing well She's doing very well from her procedure and has no evidence of any postoperative complications. She has some bruising which is improving and I suspect that this will continue to improve. Her pathology was reviewed and consistent with a mild polymyositis or immune myositis.     Plan:     She is doing fine. She can follow up with me on a when necessary basis and follow up with her rheumatologist for further guidance and treatment of her myositis

## 2012-10-14 ENCOUNTER — Encounter (INDEPENDENT_AMBULATORY_CARE_PROVIDER_SITE_OTHER): Payer: Self-pay

## 2012-10-15 ENCOUNTER — Ambulatory Visit (INDEPENDENT_AMBULATORY_CARE_PROVIDER_SITE_OTHER): Payer: BC Managed Care – PPO | Admitting: General Surgery

## 2012-12-12 ENCOUNTER — Other Ambulatory Visit: Payer: Self-pay

## 2013-04-23 ENCOUNTER — Ambulatory Visit (INDEPENDENT_AMBULATORY_CARE_PROVIDER_SITE_OTHER): Payer: BC Managed Care – PPO | Admitting: Physician Assistant

## 2013-04-23 ENCOUNTER — Encounter: Payer: Self-pay | Admitting: Physician Assistant

## 2013-04-23 VITALS — BP 110/68 | HR 76 | Temp 98.3°F | Resp 18 | Ht 64.5 in | Wt 181.0 lb

## 2013-04-23 DIAGNOSIS — J988 Other specified respiratory disorders: Secondary | ICD-10-CM

## 2013-04-23 DIAGNOSIS — F329 Major depressive disorder, single episode, unspecified: Secondary | ICD-10-CM

## 2013-04-23 DIAGNOSIS — F32A Depression, unspecified: Secondary | ICD-10-CM

## 2013-04-23 DIAGNOSIS — F3289 Other specified depressive episodes: Secondary | ICD-10-CM

## 2013-04-23 DIAGNOSIS — A499 Bacterial infection, unspecified: Secondary | ICD-10-CM

## 2013-04-23 DIAGNOSIS — B9689 Other specified bacterial agents as the cause of diseases classified elsewhere: Secondary | ICD-10-CM

## 2013-04-23 DIAGNOSIS — M329 Systemic lupus erythematosus, unspecified: Secondary | ICD-10-CM

## 2013-04-23 MED ORDER — AZITHROMYCIN 250 MG PO TABS: ORAL_TABLET | ORAL | Status: DC

## 2013-04-23 NOTE — Progress Notes (Signed)
Patient ID: Kendra Jacobson MRN: [id number], DOB: 06/17/1980, 33 y.o. Date of Encounter: 04/23/2013, 4:14 PM    Chief Complaint:  Chief Complaint  Patient presents with  . sinus infection    sinus pain, green secretions     HPI: 33 y.o. year old white female says she started to get sick 4- 5 days ago. At the beginning she had some mild sore throat and irritated throat but that has resolved. Since onset she has been "blowing her nose constantly ". However says that the drainage from her nose had been clear until yesterday-- it became much thicker and green. As well she is having a cough that sounds like congestion in her chest. She has had no fever no ear ache no sore throat.     Home Meds: See attached medication section for any medications that were entered at today's visit. The computer does not put those onto this list.The following list is a list of meds entered prior to today's visit.   Current Outpatient Prescriptions on File Prior to Visit  Medication Sig Dispense Refill  . docusate sodium (COLACE) 100 MG capsule Take 100 mg by mouth daily.       . DULoxetine (CYMBALTA) 60 MG capsule Take 60 mg by mouth every evening.       . hydroxychloroquine (PLAQUENIL) 200 MG tablet Take 400 mg by mouth every evening.       . loratadine (CLARITIN) 10 MG tablet Take 20 mg by mouth daily.       . Prenatal Vit-Fe Fumarate-FA (PRENATAL MULTIVITAMIN) TABS tablet Take 1 tablet by mouth daily at 12 noon.       No current facility-administered medications on file prior to visit.    Allergies:  Allergies  Allergen Reactions  . Erythromycin Other (See Comments)    Upset stomach       Review of Systems: See HPI for pertinent ROS. All other ROS negative.    Physical Exam: Blood pressure 110/68, pulse 76, temperature 98.3 F (36.8 C), temperature source Oral, resp. rate 18, height 5' 4.5" (1.638 m), weight 181 lb (82.101 kg)., Body mass index is 30.6 kg/(m^2). General:  WF. Appears  in no acute distress. HEENT: Normocephalic, atraumatic, eyes without discharge, sclera non-icteric, nares are without discharge. Bilateral auditory canals clear, TM's are without perforation, pearly grey and translucent with reflective cone of light bilaterally. Oral cavity moist, posterior pharynx without exudate, erythema, peritonsillar abscess. No tenderness with percussion of frontal and maxillary sinuses bilaterally.  Neck: Supple. No thyromegaly. No lymphadenopathy. Lungs: Clear bilaterally to auscultation without wheezes, rales, or rhonchi. Breathing is unlabored. Heart: Regular rhythm. No murmurs, rubs, or gallops. Msk:  Strength and tone normal for age. Extremities/Skin: Warm and dry.  Neuro: Alert and oriented X 3. Moves all extremities spontaneously. Gait is normal. CNII-XII grossly in tact. Psych:  Responds to questions appropriately with a normal affect.     ASSESSMENT AND PLAN:  33 y.o. year old female with  1. Bacterial respiratory infection Allergy list includes " erythromycin-- GI upset". Patient says that even the erythromycin causes only very minimal GI upset and no significant problems. She says that she has taken the Z-Pak before and it has caused no adverse effects and has worked well for her. We'll prescribe this now. As well if she needs over-the-counter decongestants and cough medicines for symptom relief she can do this.  Followup if symptoms do not resolve within one week after completion of antibiotic. - azithromycin (ZITHROMAX)  250 MG tablet; Day 1: Take 2 daily.  Days 2-5: Take 1 daily.  Dispense: 6 tablet; Refill: 0  2. Lupus  3. Depression   Signed, Shon Hale Clarence, [place], [facility] 04/23/2013 4:14 PM

## 2013-06-16 ENCOUNTER — Ambulatory Visit (INDEPENDENT_AMBULATORY_CARE_PROVIDER_SITE_OTHER): Payer: BC Managed Care – PPO | Admitting: Family Medicine

## 2013-06-16 ENCOUNTER — Encounter: Payer: Self-pay | Admitting: Family Medicine

## 2013-06-16 VITALS — BP 104/62 | HR 80 | Temp 96.9°F | Resp 16 | Ht 64.0 in | Wt 184.0 lb

## 2013-06-16 DIAGNOSIS — J019 Acute sinusitis, unspecified: Secondary | ICD-10-CM

## 2013-06-16 MED ORDER — CEFDINIR 300 MG PO CAPS: 300.0000 mg | ORAL_CAPSULE | Freq: Two times a day (BID) | ORAL | Status: DC

## 2013-06-16 NOTE — Progress Notes (Signed)
Subjective:    Patient ID: Kendra Jacobson, female    DOB: 09/16/1980, 33 y.o.   MRN: [id number]  HPI Patient is a pleasant 33 year old white female who is on chronic immunosuppression for lupus. She's been to allergies her last month of constant clear rhinorrhea. Over the last few days she has developed subjective fevers and chills and constant pain and pressure in both maxillary sinuses. Also the rhinorrhea has become purulent.  Her teeth also ache. Past Medical History  Diagnosis Date  . Depression   . Lupus   . Complication of anesthesia     hallucinated with pain medicine after surgery   Current Outpatient Prescriptions on File Prior to Visit  Medication Sig Dispense Refill  . azithromycin (ZITHROMAX) 250 MG tablet Day 1: Take 2 daily.  Days 2-5: Take 1 daily.  6 tablet  0  . docusate sodium (COLACE) 100 MG capsule Take 100 mg by mouth daily.       . DULoxetine (CYMBALTA) 60 MG capsule Take 60 mg by mouth every evening.       . hydroxychloroquine (PLAQUENIL) 200 MG tablet Take 400 mg by mouth every evening.       . loratadine (CLARITIN) 10 MG tablet Take 20 mg by mouth daily.       . Prenatal Vit-Fe Fumarate-FA (PRENATAL MULTIVITAMIN) TABS tablet Take 1 tablet by mouth daily at 12 noon.       No current facility-administered medications on file prior to visit.   Allergies  Allergen Reactions  . Erythromycin Other (See Comments)    Upset stomach    History   Social History  . Marital Status: Married    Spouse Name: N/A    Number of Children: N/A  . Years of Education: N/A   Occupational History  . Not on file.   Social History Main Topics  . Smoking status: Never Smoker   . Smokeless tobacco: Never Used  . Alcohol Use: No  . Drug Use: No  . Sexual Activity: Not on file   Other Topics Concern  . Not on file   Social History Narrative  . No narrative on file      Review of Systems  All other systems reviewed and are negative.      Objective:   Physical Exam  Vitals reviewed. Constitutional: She appears well-developed and well-nourished.  HENT:  Right Ear: Tympanic membrane, external ear and ear canal normal.  Left Ear: Tympanic membrane, external ear and ear canal normal.  Nose: Mucosal edema and rhinorrhea present. Right sinus exhibits maxillary sinus tenderness. Right sinus exhibits no frontal sinus tenderness. Left sinus exhibits maxillary sinus tenderness. Left sinus exhibits no frontal sinus tenderness.  Mouth/Throat: Oropharynx is clear and moist. No oropharyngeal exudate.  Eyes: Conjunctivae are normal. No scleral icterus.  Neck: Neck supple.  Cardiovascular: Normal rate, regular rhythm and normal heart sounds.  Exam reveals no gallop and no friction rub.   No murmur heard. Pulmonary/Chest: Effort normal and breath sounds normal. No respiratory distress. She has no wheezes. She has no rales.  Abdominal: Soft. Bowel sounds are normal. She exhibits no distension. There is no tenderness. There is no rebound.  Lymphadenopathy:    She has no cervical adenopathy.          Assessment & Plan:  1. Acute rhinosinusitis I believe the patient has developed a secondary bacterial sinusitis after dealing with allergic sinusitis the last month.  I will treat the secondary bacterial infection [facility]  300 mg by mouth twice a day for 10 days and nasal saline 4 times a day. Once the infection has improved I recommended that she continue Nasonex and add Claritin 10 mg by mouth daily AS WELL.  She has been off Claritin for over a month and only using the nasal steroid spray. - cefdinir (OMNICEF) 300 MG capsule; Take 1 capsule (300 mg total) by mouth 2 (two) times daily.  Dispense: 20 capsule; Refill: 0

## 2013-11-05 ENCOUNTER — Ambulatory Visit (INDEPENDENT_AMBULATORY_CARE_PROVIDER_SITE_OTHER): Payer: 59 | Admitting: *Deleted

## 2013-11-05 DIAGNOSIS — Z111 Encounter for screening for respiratory tuberculosis: Secondary | ICD-10-CM

## 2013-11-05 NOTE — Patient Instructions (Signed)
Return on Friday, 11/07/2013 to have TB test read.

## 2013-11-05 NOTE — Progress Notes (Signed)
Patient ID: Kendra Jacobson, female   DOB: 11/20/1980, 33 y.o.   MRN: [id number] Patient seen in office for TB Skin Test.   Tolerated administration well.   Will return in 48 hours to have read.

## 2013-11-07 ENCOUNTER — Ambulatory Visit: Payer: 59 | Admitting: Family Medicine

## 2013-11-07 DIAGNOSIS — Z111 Encounter for screening for respiratory tuberculosis: Secondary | ICD-10-CM

## 2013-11-07 LAB — TB SKIN TEST
Induration: 0 mm
TB Skin Test: NEGATIVE

## 2013-11-07 NOTE — Progress Notes (Signed)
Patient ID: Kendra Jacobson, female   DOB: 12/26/1980, 33 y.o.   MRN: [id number] Here for reading of TB skin test.  Test is negative.  Pt given copy of result.

## 2013-11-20 ENCOUNTER — Other Ambulatory Visit: Payer: Self-pay | Admitting: Obstetrics and Gynecology

## 2013-11-21 LAB — CYTOLOGY - PAP

## 2013-12-31 ENCOUNTER — Ambulatory Visit (INDEPENDENT_AMBULATORY_CARE_PROVIDER_SITE_OTHER): Payer: 59 | Admitting: Family Medicine

## 2013-12-31 ENCOUNTER — Encounter: Payer: Self-pay | Admitting: Family Medicine

## 2013-12-31 VITALS — BP 110/58 | HR 62 | Temp 98.1°F | Resp 14 | Ht 64.0 in | Wt 192.0 lb

## 2013-12-31 DIAGNOSIS — J069 Acute upper respiratory infection, unspecified: Secondary | ICD-10-CM

## 2013-12-31 DIAGNOSIS — J0100 Acute maxillary sinusitis, unspecified: Secondary | ICD-10-CM

## 2013-12-31 MED ORDER — GUAIFENESIN-CODEINE 100-10 MG/5ML PO SOLN: 5.0000 mL | Freq: Four times a day (QID) | ORAL | Status: DC | PRN

## 2013-12-31 MED ORDER — AMOXICILLIN-POT CLAVULANATE 875-125 MG PO TABS: 1.0000 | ORAL_TABLET | Freq: Two times a day (BID) | ORAL | Status: DC

## 2013-12-31 NOTE — Progress Notes (Signed)
Patient ID: Kendra Jacobson, female   DOB: 07/17/1980, 33 y.o.   MRN: [id number]   Subjective:    Patient ID: Kendra Jacobson, female    DOB: 01/22/1981, 33 y.o.   MRN: [id number]  Patient presents for Illness  patient here with sinus pressure transient initially clear no colored worsening over the past week she's also had a paced nasal drip and some cough which is nonproductive but feels very achy in her chest feels like she can't take a deep breath. She has a very remote history of exercise-induced asthma but has not required any medications for many years. She's not had any significant fever though she is immunocompromised and she has lupus. On prednisone 5 mg once a day    Review Of Systems:  GEN- denies +, fever, weight loss,weakness, recent illness HEENT- denies eye drainage, change in vision, +nasal discharge, CVS- denies chest pain, palpitations RESP- denies SOB, +cough, wheeze ABD- denies N/V, change in stools, abd pain GU- denies dysuria, hematuria, dribbling, incontinence MSK- denies joint pain, muscle aches, injury Neuro- denies headache, dizziness, syncope, seizure activity       Objective:    BP 110/58 mmHg  Pulse 62  Temp(Src) 98.1 F (36.7 C) (Oral)  Resp 14  Ht 5\' 4"  (1.626 m)  Wt 192 lb (87.091 kg)  BMI 32.94 kg/m2  LMP 12/30/2013 (Approximate) GEN- NAD, alert and oriented x3 HEENT- PERRL, EOMI, non injected sclera, pink conjunctiva, MMM, oropharynx mild injection, TM clear bilat no effusion,  + maxillary sinus tenderness, inflammed turbinates,  Nasal drainage  Neck- Supple, no LAD CVS- RRR, no murmur RESP-CTAB EXT- No edema Pulses- Radial 2+         Assessment & Plan:      Problem List Items Addressed This Visit    None    Visit Diagnoses    Acute maxillary sinusitis, recurrence not specified    -  Primary    worsening symptonms, treat with augmentin, nasocort,, will give robitussin AC for soreness with URI component, pt  immunocompromised    Relevant Medications       AMOXICILLIN-POT CLAVULANATE 875-125 MG PO TABS       Guaifenesin-codeine 100-10 mg/465mL po sol    Acute URI        Relevant Medications       azaTHIOprine (IMURAN) 50 MG tablet       Note: This dictation was prepared with Dragon dictation along with smaller phrase technology. Any transcriptional errors that result from this process are unintentional.

## 2014-01-14 ENCOUNTER — Ambulatory Visit (INDEPENDENT_AMBULATORY_CARE_PROVIDER_SITE_OTHER): Payer: 59 | Admitting: Family Medicine

## 2014-01-14 ENCOUNTER — Encounter: Payer: Self-pay | Admitting: Family Medicine

## 2014-01-14 VITALS — BP 120/70 | HR 76 | Temp 97.6°F | Resp 14 | Ht 63.0 in | Wt 193.0 lb

## 2014-01-14 DIAGNOSIS — N3000 Acute cystitis without hematuria: Secondary | ICD-10-CM

## 2014-01-14 DIAGNOSIS — R300 Dysuria: Secondary | ICD-10-CM

## 2014-01-14 LAB — URINALYSIS, ROUTINE W REFLEX MICROSCOPIC
BILIRUBIN URINE: NEGATIVE
Glucose, UA: NEGATIVE mg/dL
Hgb urine dipstick: NEGATIVE
KETONES UR: NEGATIVE mg/dL
NITRITE: POSITIVE — AB
PH: 6 (ref 5.0–8.0)
Urobilinogen, UA: 0.2 mg/dL (ref 0.0–1.0)

## 2014-01-14 LAB — URINALYSIS, MICROSCOPIC ONLY
CASTS: NONE SEEN
CRYSTALS: NONE SEEN
RBC / HPF: NONE SEEN RBC/hpf (ref <3)
WBC, UA: >50 WBC/hpf — AB (ref <3)

## 2014-01-14 MED ORDER — NITROFURANTOIN MONOHYD MACRO 100 MG PO CAPS: 100.0000 mg | ORAL_CAPSULE | Freq: Two times a day (BID) | ORAL | Status: DC

## 2014-01-14 MED ORDER — PHENAZOPYRIDINE HCL 100 MG PO TABS: 100.0000 mg | ORAL_TABLET | Freq: Three times a day (TID) | ORAL | Status: DC | PRN

## 2014-01-14 NOTE — Patient Instructions (Signed)
Take macrobid as prescribed Pyrdium for the pressure Plenty of water, cranberry juice F/U as needed

## 2014-01-14 NOTE — Progress Notes (Signed)
   Subjective:    Patient ID: Kendra Jacobson, female    DOB: 05/19/1980, 33 y.o.   MRN: [id number]  Patient presents for UTI  patient here with dysuria and urinary frequency and dribbling for the past 24 hours. She does have history of urinary tract infections in the past would happen after intercourse. She did have increased intercourse past couple weeks or they are trying for a child. She's not had any vaginal discharge no fever. Her previous upper respiratory/ sinus infection has cleared up.    Review Of Systems:  GEN- denies fatigue, fever, weight loss,weakness, recent illness HEENT- denies eye drainage, change in vision, nasal discharge, CVS- denies chest pain, palpitations RESP- denies SOB, cough, wheeze ABD- denies N/V, change in stools, abd pain GU- + dysuria, hematuria, dribbling, incontinence MSK- denies joint pain, muscle aches, injury Neuro- denies headache, dizziness, syncope, seizure activity       Objective:    BP 120/70 mmHg  Pulse 76  Temp(Src) 97.6 F (36.4 C) (Oral)  Resp 14  Ht 5\' 3"  (1.6 m)  Wt 193 lb (87.544 kg)  BMI 34.20 kg/m2  LMP 12/30/2013 (Approximate) GEN- NAD, alert and oriented x3 ABD-NABS,soft,+ suprapubic pressure/tenderness, no CVA tenderness EXT- No edema Pulses- Radial, DP- 2+        Assessment & Plan:      Problem List Items Addressed This Visit    None    Visit Diagnoses    Acute cystitis without hematuria    -  Primary    Treat with macrobid, pyridium, if she has recurrent infections, would dose after intercourse     Burning with urination        Relevant Orders       Urinalysis, Routine w reflex microscopic (Completed)       Note: This dictation was prepared with Dragon dictation along with smaller phrase technology. Any transcriptional errors that result from this process are unintentional.

## 2014-02-12 ENCOUNTER — Ambulatory Visit (INDEPENDENT_AMBULATORY_CARE_PROVIDER_SITE_OTHER): Payer: 59 | Admitting: Family Medicine

## 2014-02-12 ENCOUNTER — Encounter: Payer: Self-pay | Admitting: Family Medicine

## 2014-02-12 LAB — COMPLETE METABOLIC PANEL WITH GFR
ALK PHOS: 64 U/L (ref 39–117)
ALT: 19 U/L (ref 0–35)
AST: 18 U/L (ref 0–37)
Albumin: 4.3 g/dL (ref 3.5–5.2)
BUN: 11 mg/dL (ref 6–23)
CALCIUM: 9.4 mg/dL (ref 8.4–10.5)
CHLORIDE: 103 meq/L (ref 96–112)
CO2: 24 mEq/L (ref 19–32)
CREATININE: 0.69 mg/dL (ref 0.50–1.10)
GFR, Est African American: >89 mL/min
GFR, Est Non African American: >89 mL/min
Glucose, Bld: 76 mg/dL (ref 70–99)
Potassium: 4.1 mEq/L (ref 3.5–5.3)
Sodium: 139 mEq/L (ref 135–145)
Total Bilirubin: 0.5 mg/dL (ref 0.2–1.2)
Total Protein: 7.1 g/dL (ref 6.0–8.3)

## 2014-02-12 LAB — TSH: TSH: 2.32 u[IU]/mL (ref 0.350–4.500)

## 2014-02-12 LAB — HEMOGLOBIN A1C
HEMOGLOBIN A1C: 5 % (ref <5.7)
MEAN PLASMA GLUCOSE: 97 mg/dL (ref <117)

## 2014-02-12 LAB — LIPID PANEL
CHOL/HDL RATIO: 2.9 ratio
Cholesterol: 200 mg/dL (ref 0–200)
HDL: 70 mg/dL (ref >39)
LDL CALC: 119 mg/dL — AB (ref 0–99)
TRIGLYCERIDES: 56 mg/dL (ref <150)
VLDL: 11 mg/dL (ref 0–40)

## 2014-02-12 NOTE — Progress Notes (Signed)
Subjective:    Patient ID: Kendra Jacobson, female    DOB: 11/13/1980, 34 y.o.   MRN: [id number]  HPI  Patient is a very pleasant 34 year old white female who unfortunately has lupus. She is on several immunosuppressive including prednisone. Due to her medication she has a difficult time losing weight. Currently she is 5 foot 4 inches and weighs 194 pounds. She is tried diet and exercise with very little success. She has a very strong family history of heart disease and diabetes and is therefore requesting a fasting lipid panel and hemoglobin A1c today. She is also concerned that her thyroid may be underactive making it harder for her to lose weight. She is tried exercising for 5 days a week using cardio as well as weight resistance training and eating a healthy diet and has still failed to lose weight and therefore would like to check her thyroid. She does have fatigue and constipation. She denies any palpitations. Her and her husband are trying to conceive a child. She is interested in medications for weight loss. Past Medical History  Diagnosis Date  . Depression   . Lupus   . Complication of anesthesia     hallucinated with pain medicine after surgery   Past Surgical History  Procedure Laterality Date  . Tonsillectomy  1999  . Cesarean section  04/2009  . Wisdom tooth extraction  2000    x4  . Muscle biopsy Left 09/18/2012    Procedure: MUSCLE BIOPSY;  Surgeon: Lodema PilotBrian Layton, DO;  Location: [facility];  Service: General;  Laterality: Left;   Current Outpatient Prescriptions on File Prior to Visit  Medication Sig Dispense Refill  . azaTHIOprine (IMURAN) 50 MG tablet   3  . docusate sodium (COLACE) 100 MG capsule Take 100 mg by mouth daily.     . hydroxychloroquine (PLAQUENIL) 200 MG tablet Take 400 mg by mouth every evening.     . phenazopyridine (PYRIDIUM) 100 MG tablet Take 1 tablet (100 mg total) by mouth 3 (three) times daily as needed for pain. 10 tablet 0  . predniSONE  (DELTASONE) 5 MG tablet Take 5 mg by mouth daily with breakfast.    . Prenatal Vit-Fe Fumarate-FA (PRENATAL MULTIVITAMIN) TABS tablet Take 1 tablet by mouth daily at 12 noon.     No current facility-administered medications on file prior to visit.   Allergies  Allergen Reactions  . Erythromycin Other (See Comments)    Upset stomach    History   Social History  . Marital Status: Married    Spouse Name: N/A    Number of Children: N/A  . Years of Education: N/A   Occupational History  . Not on file.   Social History Main Topics  . Smoking status: Never Smoker   . Smokeless tobacco: Never Used  . Alcohol Use: No  . Drug Use: No  . Sexual Activity: Not on file   Other Topics Concern  . Not on file   Social History Narrative     Review of Systems  All other systems reviewed and are negative.      Objective:   Physical Exam  Neck: No thyromegaly present.  Cardiovascular: Normal rate, regular rhythm and normal heart sounds.   Pulmonary/Chest: Effort normal and breath sounds normal. No respiratory distress. She has no wheezes. She has no rales.  Abdominal: Soft. Bowel sounds are normal.  Vitals reviewed.         Assessment & Plan:  Morbid obesity - Plan: COMPLETE  METABOLIC PANEL WITH GFR, Hemoglobin A1c, Lipid panel, TSH  We discussed therapeutic lifestyle changes including a 1200-calorie a day diet and 30 minutes to an hour of aerobic exercise for 5 days a week. I would not recommend any medication for weight loss while she is trying to become pregnant. Afterward, Adipex would be her safest short-term option.  I will also check a CBC, CMP, fasting lipid panel, and TSH.

## 2014-05-10 ENCOUNTER — Inpatient Hospital Stay (HOSPITAL_COMMUNITY)
Admission: AD | Admit: 2014-05-10 | Discharge: 2014-05-10 | Disposition: A | Discharge status: Home / Self Care | Payer: 59 | Source: Ambulatory Visit | Attending: Obstetrics and Gynecology | Admitting: Obstetrics and Gynecology

## 2014-05-10 ENCOUNTER — Encounter (HOSPITAL_COMMUNITY): Payer: Self-pay | Admitting: *Deleted

## 2014-05-10 DIAGNOSIS — O99612 Diseases of the digestive system complicating pregnancy, second trimester: Secondary | ICD-10-CM | POA: Diagnosis not present

## 2014-05-10 DIAGNOSIS — K529 Noninfective gastroenteritis and colitis, unspecified: Secondary | ICD-10-CM | POA: Diagnosis not present

## 2014-05-10 DIAGNOSIS — O210 Mild hyperemesis gravidarum: Secondary | ICD-10-CM | POA: Diagnosis present

## 2014-05-10 DIAGNOSIS — Z3A15 15 weeks gestation of pregnancy: Secondary | ICD-10-CM | POA: Diagnosis not present

## 2014-05-10 LAB — URINE MICROSCOPIC-ADD ON

## 2014-05-10 LAB — URINALYSIS, ROUTINE W REFLEX MICROSCOPIC
BILIRUBIN URINE: NEGATIVE
GLUCOSE, UA: NEGATIVE mg/dL
HGB URINE DIPSTICK: NEGATIVE
Ketones, ur: 15 mg/dL — AB
NITRITE: NEGATIVE
PH: 6 (ref 5.0–8.0)
PROTEIN: 30 mg/dL — AB
Urobilinogen, UA: 0.2 mg/dL (ref 0.0–1.0)

## 2014-05-10 LAB — POCT PREGNANCY, URINE: Preg Test, Ur: POSITIVE — AB

## 2014-05-10 MED ORDER — ONDANSETRON HCL 4 MG PO TABS: 4.0000 mg | ORAL_TABLET | Freq: Four times a day (QID) | ORAL | Status: DC

## 2014-05-10 MED ORDER — ONDANSETRON HCL 4 MG/2ML IJ SOLN
4.0000 mg | Freq: Once | INTRAMUSCULAR | Status: AC
  Administered 2014-05-10: 4 mg via INTRAVENOUS
  Filled 2014-05-10: qty 2

## 2014-05-10 MED ORDER — PROMETHAZINE HCL 25 MG PO TABS: 25.0000 mg | ORAL_TABLET | Freq: Four times a day (QID) | ORAL | Status: DC | PRN

## 2014-05-10 MED ORDER — PROMETHAZINE HCL 25 MG/ML IJ SOLN
12.5000 mg | Freq: Once | INTRAMUSCULAR | Status: AC
  Administered 2014-05-10: 12.5 mg via INTRAVENOUS
  Filled 2014-05-10: qty 1

## 2014-05-10 MED ORDER — DEXTROSE 5 % IN LACTATED RINGERS IV BOLUS
1000.0000 mL | Freq: Once | INTRAVENOUS | Status: AC
  Administered 2014-05-10: 1000 mL via INTRAVENOUS

## 2014-05-10 NOTE — MAU Note (Signed)
Pt states that she feels better after zofran, but that phenergan does not help at all.

## 2014-05-10 NOTE — MAU Provider Note (Signed)
History     CSN: [id number]  Arrival date and time: 05/10/14 1639   First Provider Initiated Contact with Patient 05/10/14 1736      Chief Complaint  Patient presents with  . Emesis  . Diarrhea   HPI 34 y.o. G3P1011 at [redacted] weeks EGA w/ nausea, vomiting, diarrhea starting today, 2 family members sick with same symptoms. No fever, + chills, no pain, no vaginal bleeding.   Past Medical History  Diagnosis Date  . Depression   . Lupus   . Complication of anesthesia     hallucinated with pain medicine after surgery    Past Surgical History  Procedure Laterality Date  . Tonsillectomy  1999  . Cesarean section  04/2009  . Wisdom tooth extraction  2000    x4  . Muscle biopsy Left 09/18/2012    Procedure: MUSCLE BIOPSY;  Surgeon: Lodema Pilot, DO;  Location: [facility];  Service: General;  Laterality: Left;    Family History  Problem Relation Age of Onset  . Asthma Mother   . Hypertension Mother   . Parkinson's disease Father   . Hyperlipidemia Father     History  Substance Use Topics  . Smoking status: Never Smoker   . Smokeless tobacco: Never Used  . Alcohol Use: No    Allergies:  Allergies  Allergen Reactions  . Erythromycin Other (See Comments)    Upset stomach     Prescriptions prior to admission  Medication Sig Dispense Refill Last Dose  . azaTHIOprine (IMURAN) 50 MG tablet Take 50-100 mg by mouth daily. 100 mg AM, 50 mg PM  3 05/09/2014 at Unknown time  . doxylamine, Sleep, (UNISOM) 25 MG tablet Take 25 mg by mouth at bedtime as needed (nausea).   05/09/2014 at Unknown time  . hydroxychloroquine (PLAQUENIL) 200 MG tablet Take 400 mg by mouth every evening.    05/09/2014 at Unknown time  . predniSONE (DELTASONE) 5 MG tablet Take 5 mg by mouth daily with breakfast.   05/09/2014 at Unknown time  . Prenatal Vit-Fe Fumarate-FA (PRENATAL MULTIVITAMIN) TABS tablet Take 1 tablet by mouth daily at 12 noon.   05/09/2014 at Unknown time  . PYRIDOXINE HCL PO Take 1 tablet by mouth at  bedtime as needed (nausea).   05/09/2014 at Unknown time  . phenazopyridine (PYRIDIUM) 100 MG tablet Take 1 tablet (100 mg total) by mouth 3 (three) times daily as needed for pain. (Patient not taking: Reported on 05/10/2014) 10 tablet 0 Taking    Review of Systems  Constitutional: Positive for chills. Negative for fever.  Respiratory: Negative.   Cardiovascular: Negative.   Gastrointestinal: Positive for nausea, vomiting and diarrhea. Negative for abdominal pain and constipation.  Genitourinary: Negative for dysuria, urgency, frequency, hematuria and flank pain.       Negative for vaginal bleeding, cramping/contractions  Musculoskeletal: Negative.   Neurological: Negative.   Psychiatric/Behavioral: Negative.    Physical Exam   Last menstrual period 12/30/2013.  Physical Exam  Nursing note and vitals reviewed. Constitutional: She is oriented to person, place, and time. She appears well-developed and well-nourished. No distress.  Cardiovascular: Normal rate.   Respiratory: Effort normal.  GI: Soft. She exhibits no distension and no mass. There is no tenderness. There is no rebound and no guarding.  Musculoskeletal: Normal range of motion.  Neurological: She is alert and oriented to person, place, and time.  Skin: Skin is warm and dry.  Psychiatric: She has a normal mood and affect.   + FHR MAU  Course  Procedures Results for orders placed or performed during the hospital encounter of 05/10/14 (from the past 24 hour(s))  Urinalysis, Routine w reflex microscopic     Status: Abnormal   Collection Time: 05/10/14  4:39 PM  Result Value Ref Range   Color, Urine YELLOW YELLOW   APPearance CLEAR CLEAR   Specific Gravity, Urine >1.030 (H) 1.005 - 1.030   pH 6.0 5.0 - 8.0   Glucose, UA NEGATIVE NEGATIVE mg/dL   Hgb urine dipstick NEGATIVE NEGATIVE   Bilirubin Urine NEGATIVE NEGATIVE   Ketones, ur 15 (A) NEGATIVE mg/dL   Protein, ur 30 (A) NEGATIVE mg/dL   Urobilinogen, UA 0.2 0.0 - 1.0  mg/dL   Nitrite NEGATIVE NEGATIVE   Leukocytes, UA TRACE (A) NEGATIVE  Urine microscopic-add on     Status: None   Collection Time: 05/10/14  4:39 PM  Result Value Ref Range   Squamous Epithelial / LPF RARE RARE   WBC, UA 0-2 <3 WBC/hpf   Bacteria, UA RARE RARE  Pregnancy, urine POC     Status: Abnormal   Collection Time: 05/10/14  5:03 PM  Result Value Ref Range   Preg Test, Ur POSITIVE (A) NEGATIVE   IV hydration w/ D5LR, Phenergan 12.5 mg IV - initially doing well after phenergan, no vomiting or diarrhea while in MAU, then reports increased nausea, Zofran 4mg  IV ordered, nausea improved.  Assessment and Plan   1. Gastroenteritis, acute   Rx phenergan and Zofran, f/u if not improved or worsening, otherwise f/u at next prenatal visit.     Medication List    STOP taking these medications        phenazopyridine 100 MG tablet  Commonly known as:  PYRIDIUM      TAKE these medications        azaTHIOprine 50 MG tablet  Commonly known as:  IMURAN  Take 50-100 mg by mouth daily. 100 mg AM, 50 mg PM     doxylamine (Sleep) 25 MG tablet  Commonly known as:  UNISOM  Take 25 mg by mouth at bedtime as needed (nausea).     hydroxychloroquine 200 MG tablet  Commonly known as:  PLAQUENIL  Take 400 mg by mouth every evening.     ondansetron 4 MG tablet  Commonly known as:  ZOFRAN  Take 1 tablet (4 mg total) by mouth every 6 (six) hours.     predniSONE 5 MG tablet  Commonly known as:  DELTASONE  Take 5 mg by mouth daily with breakfast.     prenatal multivitamin Tabs tablet  Take 1 tablet by mouth daily at 12 noon.     promethazine 25 MG tablet  Commonly known as:  PHENERGAN  Take 1 tablet (25 mg total) by mouth every 6 (six) hours as needed for nausea or vomiting.     PYRIDOXINE HCL PO  Take 1 tablet by mouth at bedtime as needed (nausea).        Follow-up Information    Follow up with Meriel PicaHOLLAND,RICHARD M, MD.   Specialty:  Obstetrics and Gynecology   Why:  as scheduled  or sooner as needed   Contact information:   [address] [place] [address] [phone]         [facility] LLCFRAZIER,Rollo Farquhar 05/10/2014, 9:09 PM

## 2014-05-10 NOTE — MAU Note (Signed)
Pt stats that she has had vomiting and diarrhea since this morning.  She says she just started feeling bad today.  Denies LOF.VB.

## 2014-10-03 ENCOUNTER — Encounter (HOSPITAL_COMMUNITY)
Admission: AD | Disposition: A | Discharge status: Home / Self Care | Payer: Self-pay | Source: Ambulatory Visit | Attending: Obstetrics and Gynecology

## 2014-10-03 ENCOUNTER — Inpatient Hospital Stay (HOSPITAL_COMMUNITY): Payer: 59 | Admitting: Anesthesiology

## 2014-10-03 ENCOUNTER — Inpatient Hospital Stay (HOSPITAL_COMMUNITY)
Admission: AD | Admit: 2014-10-03 | Discharge: 2014-10-06 | DRG: 766 | Disposition: A | Discharge status: Home / Self Care | Payer: 59 | Source: Ambulatory Visit | Attending: Obstetrics and Gynecology | Admitting: Obstetrics and Gynecology

## 2014-10-03 ENCOUNTER — Encounter (HOSPITAL_COMMUNITY): Payer: Self-pay

## 2014-10-03 DIAGNOSIS — O3421 Maternal care for scar from previous cesarean delivery: Principal | ICD-10-CM | POA: Diagnosis present

## 2014-10-03 DIAGNOSIS — Z6835 Body mass index (BMI) 35.0-35.9, adult: Secondary | ICD-10-CM | POA: Diagnosis not present

## 2014-10-03 DIAGNOSIS — O99344 Other mental disorders complicating childbirth: Secondary | ICD-10-CM | POA: Diagnosis present

## 2014-10-03 DIAGNOSIS — E669 Obesity, unspecified: Secondary | ICD-10-CM | POA: Diagnosis present

## 2014-10-03 DIAGNOSIS — F419 Anxiety disorder, unspecified: Secondary | ICD-10-CM | POA: Diagnosis present

## 2014-10-03 DIAGNOSIS — Z98891 History of uterine scar from previous surgery: Secondary | ICD-10-CM

## 2014-10-03 DIAGNOSIS — F329 Major depressive disorder, single episode, unspecified: Secondary | ICD-10-CM | POA: Diagnosis present

## 2014-10-03 DIAGNOSIS — M329 Systemic lupus erythematosus, unspecified: Secondary | ICD-10-CM | POA: Diagnosis present

## 2014-10-03 DIAGNOSIS — Z3A35 35 weeks gestation of pregnancy: Secondary | ICD-10-CM | POA: Diagnosis present

## 2014-10-03 DIAGNOSIS — O99214 Obesity complicating childbirth: Secondary | ICD-10-CM | POA: Diagnosis present

## 2014-10-03 LAB — CBC
HEMATOCRIT: 39.4 % (ref 36.0–46.0)
Hemoglobin: 13.6 g/dL (ref 12.0–15.0)
MCH: 32.4 pg (ref 26.0–34.0)
MCHC: 34.5 g/dL (ref 30.0–36.0)
MCV: 93.8 fL (ref 78.0–100.0)
Platelets: 226 10*3/uL (ref 150–400)
RBC: 4.2 MIL/uL (ref 3.87–5.11)
RDW: 13.3 % (ref 11.5–15.5)
WBC: 9.3 10*3/uL (ref 4.0–10.5)

## 2014-10-03 LAB — URINALYSIS, ROUTINE W REFLEX MICROSCOPIC
Bilirubin Urine: NEGATIVE
GLUCOSE, UA: NEGATIVE mg/dL
Hgb urine dipstick: NEGATIVE
Ketones, ur: 15 mg/dL — AB
LEUKOCYTES UA: NEGATIVE
NITRITE: NEGATIVE
PH: 7 (ref 5.0–8.0)
Protein, ur: NEGATIVE mg/dL
SPECIFIC GRAVITY, URINE: 1.015 (ref 1.005–1.030)
Urobilinogen, UA: 0.2 mg/dL (ref 0.0–1.0)

## 2014-10-03 LAB — RPR: RPR Ser Ql: NONREACTIVE

## 2014-10-03 SURGERY — Surgical Case
Anesthesia: Spinal

## 2014-10-03 MED ORDER — NALBUPHINE HCL 10 MG/ML IJ SOLN: 5.0000 mg | INTRAMUSCULAR | Status: DC | PRN

## 2014-10-03 MED ORDER — SIMETHICONE 80 MG PO CHEW
80.0000 mg | CHEWABLE_TABLET | ORAL | Status: DC
Start: 2014-10-04 — End: 2014-10-06
  Administered 2014-10-04 – 2014-10-05 (×3): 80 mg via ORAL
  Filled 2014-10-03 (×3): qty 1

## 2014-10-03 MED ORDER — LACTATED RINGERS IV SOLN
INTRAVENOUS | Status: DC | PRN
  Administered 2014-10-03: 08:00:00 via INTRAVENOUS

## 2014-10-03 MED ORDER — FAMOTIDINE IN NACL 20-0.9 MG/50ML-% IV SOLN
20.0000 mg | Freq: Once | INTRAVENOUS | Status: AC
  Administered 2014-10-03: 20 mg via INTRAVENOUS
  Filled 2014-10-03: qty 50

## 2014-10-03 MED ORDER — DIPHENHYDRAMINE HCL 50 MG/ML IJ SOLN: 12.5000 mg | INTRAMUSCULAR | Status: DC | PRN

## 2014-10-03 MED ORDER — KETOROLAC TROMETHAMINE 30 MG/ML IJ SOLN
30.0000 mg | Freq: Four times a day (QID) | INTRAMUSCULAR | Status: AC | PRN
  Administered 2014-10-03: 30 mg via INTRAMUSCULAR

## 2014-10-03 MED ORDER — DIPHENHYDRAMINE HCL 25 MG PO CAPS
25.0000 mg | ORAL_CAPSULE | Freq: Four times a day (QID) | ORAL | Status: DC | PRN
Start: 2014-10-03 — End: 2014-10-06

## 2014-10-03 MED ORDER — AZATHIOPRINE 50 MG PO TABS
100.0000 mg | ORAL_TABLET | Freq: Every day | ORAL | Status: DC
  Administered 2014-10-03 – 2014-10-05 (×3): 100 mg via ORAL
  Filled 2014-10-03 (×3): qty 2

## 2014-10-03 MED ORDER — MORPHINE SULFATE 0.5 MG/ML IJ SOLN
INTRAMUSCULAR | Status: AC
  Filled 2014-10-03: qty 100

## 2014-10-03 MED ORDER — OXYCODONE-ACETAMINOPHEN 5-325 MG PO TABS
2.0000 | ORAL_TABLET | ORAL | Status: DC | PRN
Start: 2014-10-03 — End: 2014-10-06
  Administered 2014-10-04 – 2014-10-05 (×4): 2 via ORAL
  Filled 2014-10-03 (×4): qty 2

## 2014-10-03 MED ORDER — SENNOSIDES-DOCUSATE SODIUM 8.6-50 MG PO TABS
2.0000 | ORAL_TABLET | ORAL | Status: DC
  Administered 2014-10-04: 2 via ORAL
  Filled 2014-10-03 (×3): qty 2

## 2014-10-03 MED ORDER — FENTANYL CITRATE (PF) 100 MCG/2ML IJ SOLN
INTRAMUSCULAR | Status: DC | PRN
  Administered 2014-10-03: 10 ug via INTRATHECAL

## 2014-10-03 MED ORDER — SODIUM CHLORIDE 0.9 % IR SOLN
Status: DC | PRN
  Administered 2014-10-03: 1000 mL

## 2014-10-03 MED ORDER — ONDANSETRON HCL 4 MG/2ML IJ SOLN
INTRAMUSCULAR | Status: AC
  Filled 2014-10-03: qty 2

## 2014-10-03 MED ORDER — SODIUM CHLORIDE 0.9 % IJ SOLN: 3.0000 mL | INTRAMUSCULAR | Status: DC | PRN

## 2014-10-03 MED ORDER — WITCH HAZEL-GLYCERIN EX PADS: 1.0000 "application " | MEDICATED_PAD | CUTANEOUS | Status: DC | PRN

## 2014-10-03 MED ORDER — LACTATED RINGERS IV SOLN
INTRAVENOUS | Status: DC
  Administered 2014-10-03: 1 mL via INTRAVENOUS
  Administered 2014-10-04: 01:00:00 via INTRAVENOUS

## 2014-10-03 MED ORDER — ONDANSETRON HCL 4 MG/2ML IJ SOLN: 4.0000 mg | Freq: Three times a day (TID) | INTRAMUSCULAR | Status: DC | PRN

## 2014-10-03 MED ORDER — CITRIC ACID-SODIUM CITRATE 334-500 MG/5ML PO SOLN
30.0000 mL | Freq: Once | ORAL | Status: AC
  Administered 2014-10-03: 30 mL via ORAL
  Filled 2014-10-03: qty 15

## 2014-10-03 MED ORDER — PRENATAL MULTIVITAMIN CH
1.0000 | ORAL_TABLET | Freq: Every day | ORAL | Status: DC
Start: 2014-10-03 — End: 2014-10-06
  Administered 2014-10-04 – 2014-10-06 (×3): 1 via ORAL
  Filled 2014-10-03 (×3): qty 1

## 2014-10-03 MED ORDER — LACTATED RINGERS IV SOLN: INTRAVENOUS | Status: DC

## 2014-10-03 MED ORDER — BUPIVACAINE IN DEXTROSE 0.75-8.25 % IT SOLN
INTRATHECAL | Status: DC | PRN
  Administered 2014-10-03: 1.6 mL via INTRATHECAL

## 2014-10-03 MED ORDER — SIMETHICONE 80 MG PO CHEW: 80.0000 mg | CHEWABLE_TABLET | ORAL | Status: DC | PRN

## 2014-10-03 MED ORDER — MORPHINE SULFATE (PF) 0.5 MG/ML IJ SOLN
INTRAMUSCULAR | Status: DC | PRN
  Administered 2014-10-03: .2 mg via INTRATHECAL

## 2014-10-03 MED ORDER — FENTANYL CITRATE (PF) 100 MCG/2ML IJ SOLN
INTRAMUSCULAR | Status: AC
  Filled 2014-10-03: qty 4

## 2014-10-03 MED ORDER — ACETAMINOPHEN 325 MG PO TABS: 650.0000 mg | ORAL_TABLET | ORAL | Status: DC | PRN

## 2014-10-03 MED ORDER — OXYTOCIN 40 UNITS IN LACTATED RINGERS INFUSION - SIMPLE MED
62.5000 mL/h | INTRAVENOUS | Status: AC
Start: 2014-10-03 — End: 2014-10-04

## 2014-10-03 MED ORDER — FENTANYL CITRATE (PF) 100 MCG/2ML IJ SOLN: 25.0000 ug | INTRAMUSCULAR | Status: DC | PRN

## 2014-10-03 MED ORDER — CEFAZOLIN SODIUM-DEXTROSE 2-3 GM-% IV SOLR
2.0000 g | INTRAVENOUS | Status: AC
  Administered 2014-10-03: 2 g via INTRAVENOUS
  Filled 2014-10-03: qty 50

## 2014-10-03 MED ORDER — NALOXONE HCL 1 MG/ML IJ SOLN
1.0000 ug/kg/h | INTRAVENOUS | Status: DC | PRN
  Filled 2014-10-03: qty 2

## 2014-10-03 MED ORDER — SIMETHICONE 80 MG PO CHEW
80.0000 mg | CHEWABLE_TABLET | Freq: Three times a day (TID) | ORAL | Status: DC
  Administered 2014-10-03 – 2014-10-06 (×8): 80 mg via ORAL
  Filled 2014-10-03 (×8): qty 1

## 2014-10-03 MED ORDER — OXYTOCIN 10 UNIT/ML IJ SOLN
40.0000 [IU] | INTRAVENOUS | Status: DC | PRN
  Administered 2014-10-03: 40 [IU] via INTRAVENOUS

## 2014-10-03 MED ORDER — FAMOTIDINE IN NACL 20-0.9 MG/50ML-% IV SOLN
INTRAVENOUS | Status: AC
  Filled 2014-10-03: qty 50

## 2014-10-03 MED ORDER — PHENYLEPHRINE 8 MG IN D5W 100 ML (0.08MG/ML) PREMIX OPTIME
INJECTION | INTRAVENOUS | Status: AC
  Filled 2014-10-03: qty 100

## 2014-10-03 MED ORDER — NALOXONE HCL 0.4 MG/ML IJ SOLN: 0.4000 mg | INTRAMUSCULAR | Status: DC | PRN

## 2014-10-03 MED ORDER — LANOLIN HYDROUS EX OINT: 1.0000 "application " | TOPICAL_OINTMENT | CUTANEOUS | Status: DC | PRN

## 2014-10-03 MED ORDER — PHENYLEPHRINE 8 MG IN D5W 100 ML (0.08MG/ML) PREMIX OPTIME
INJECTION | INTRAVENOUS | Status: DC | PRN
  Administered 2014-10-03: 60 ug/min via INTRAVENOUS

## 2014-10-03 MED ORDER — PREDNISONE 5 MG PO TABS
5.0000 mg | ORAL_TABLET | Freq: Every day | ORAL | Status: DC
  Administered 2014-10-04 – 2014-10-06 (×3): 5 mg via ORAL
  Filled 2014-10-03 (×3): qty 1

## 2014-10-03 MED ORDER — LACTATED RINGERS IV SOLN
INTRAVENOUS | Status: DC
  Administered 2014-10-03 (×3): via INTRAVENOUS

## 2014-10-03 MED ORDER — KETOROLAC TROMETHAMINE 30 MG/ML IJ SOLN
INTRAMUSCULAR | Status: AC
  Filled 2014-10-03: qty 1

## 2014-10-03 MED ORDER — KETOROLAC TROMETHAMINE 30 MG/ML IJ SOLN
30.0000 mg | Freq: Four times a day (QID) | INTRAMUSCULAR | Status: AC | PRN
  Administered 2014-10-03: 30 mg via INTRAVENOUS
  Filled 2014-10-03: qty 1

## 2014-10-03 MED ORDER — NALBUPHINE HCL 10 MG/ML IJ SOLN: 5.0000 mg | Freq: Once | INTRAMUSCULAR | Status: DC | PRN

## 2014-10-03 MED ORDER — MENTHOL 3 MG MT LOZG: 1.0000 | LOZENGE | OROMUCOSAL | Status: DC | PRN

## 2014-10-03 MED ORDER — CITRIC ACID-SODIUM CITRATE 334-500 MG/5ML PO SOLN
ORAL | Status: AC
  Filled 2014-10-03: qty 15

## 2014-10-03 MED ORDER — MEPERIDINE HCL 25 MG/ML IJ SOLN: 6.2500 mg | INTRAMUSCULAR | Status: DC | PRN

## 2014-10-03 MED ORDER — ONDANSETRON HCL 4 MG/2ML IJ SOLN: 4.0000 mg | Freq: Once | INTRAMUSCULAR | Status: DC | PRN

## 2014-10-03 MED ORDER — IBUPROFEN 600 MG PO TABS
600.0000 mg | ORAL_TABLET | Freq: Four times a day (QID) | ORAL | Status: DC
Start: 2014-10-03 — End: 2014-10-06
  Administered 2014-10-04 – 2014-10-06 (×11): 600 mg via ORAL
  Filled 2014-10-03 (×11): qty 1

## 2014-10-03 MED ORDER — DIPHENHYDRAMINE HCL 25 MG PO CAPS: 25.0000 mg | ORAL_CAPSULE | ORAL | Status: DC | PRN

## 2014-10-03 MED ORDER — CEFAZOLIN SODIUM-DEXTROSE 2-3 GM-% IV SOLR: 2.0000 g | INTRAVENOUS | Status: DC

## 2014-10-03 MED ORDER — LACTATED RINGERS IV BOLUS (SEPSIS)
1000.0000 mL | Freq: Once | INTRAVENOUS | Status: AC
  Administered 2014-10-03: 1000 mL via INTRAVENOUS

## 2014-10-03 MED ORDER — CEFAZOLIN SODIUM-DEXTROSE 2-3 GM-% IV SOLR
INTRAVENOUS | Status: AC
Start: 2014-10-03 — End: 2014-10-03
  Filled 2014-10-03: qty 50

## 2014-10-03 MED ORDER — OXYTOCIN 10 UNIT/ML IJ SOLN
INTRAMUSCULAR | Status: AC
  Filled 2014-10-03: qty 4

## 2014-10-03 MED ORDER — DIBUCAINE 1 % RE OINT: 1.0000 "application " | TOPICAL_OINTMENT | RECTAL | Status: DC | PRN

## 2014-10-03 MED ORDER — AZATHIOPRINE 50 MG PO TABS
50.0000 mg | ORAL_TABLET | Freq: Every day | ORAL | Status: DC
  Administered 2014-10-04 – 2014-10-06 (×3): 50 mg via ORAL
  Filled 2014-10-03 (×3): qty 1

## 2014-10-03 MED ORDER — HYDROXYCHLOROQUINE SULFATE 200 MG PO TABS
400.0000 mg | ORAL_TABLET | Freq: Every evening | ORAL | Status: DC
  Administered 2014-10-03 – 2014-10-05 (×3): 400 mg via ORAL
  Filled 2014-10-03 (×3): qty 2

## 2014-10-03 MED ORDER — ZOLPIDEM TARTRATE 5 MG PO TABS: 5.0000 mg | ORAL_TABLET | Freq: Every evening | ORAL | Status: DC | PRN

## 2014-10-03 MED ORDER — ONDANSETRON HCL 4 MG/2ML IJ SOLN
INTRAMUSCULAR | Status: DC | PRN
  Administered 2014-10-03: 4 mg via INTRAVENOUS

## 2014-10-03 MED ORDER — TETANUS-DIPHTH-ACELL PERTUSSIS 5-2.5-18.5 LF-MCG/0.5 IM SUSP: 0.5000 mL | Freq: Once | INTRAMUSCULAR | Status: DC

## 2014-10-03 MED ORDER — OXYCODONE-ACETAMINOPHEN 5-325 MG PO TABS
1.0000 | ORAL_TABLET | ORAL | Status: DC | PRN
  Administered 2014-10-04 – 2014-10-05 (×3): 1 via ORAL
  Filled 2014-10-03 (×3): qty 1

## 2014-10-03 MED ORDER — SCOPOLAMINE 1 MG/3DAYS TD PT72: 1.0000 | MEDICATED_PATCH | Freq: Once | TRANSDERMAL | Status: DC

## 2014-10-03 SURGICAL SUPPLY — 30 items
BARRIER ADHS 3X4 INTERCEED (GAUZE/BANDAGES/DRESSINGS) IMPLANT
BENZOIN TINCTURE PRP APPL 2/3 (GAUZE/BANDAGES/DRESSINGS) ×2 IMPLANT
CLAMP CORD UMBIL (MISCELLANEOUS) IMPLANT
CLOTH BEACON ORANGE TIMEOUT ST (SAFETY) ×2 IMPLANT
CONTAINER PREFILL 10% NBF 15ML (MISCELLANEOUS) IMPLANT
DRAPE SHEET LG 3/4 BI-LAMINATE (DRAPES) IMPLANT
DRSG OPSITE POSTOP 4X10 (GAUZE/BANDAGES/DRESSINGS) ×2 IMPLANT
DURAPREP 26ML APPLICATOR (WOUND CARE) ×2 IMPLANT
ELECT REM PT RETURN 9FT ADLT (ELECTROSURGICAL) ×2
ELECTRODE REM PT RTRN 9FT ADLT (ELECTROSURGICAL) ×1 IMPLANT
EXTRACTOR VACUUM M CUP 4 TUBE (SUCTIONS) IMPLANT
GLOVE BIO SURGEON STRL SZ 6.5 (GLOVE) ×2 IMPLANT
GOWN STRL REUS W/TWL LRG LVL3 (GOWN DISPOSABLE) ×4 IMPLANT
KIT ABG SYR 3ML LUER SLIP (SYRINGE) IMPLANT
NEEDLE HYPO 22GX1.5 SAFETY (NEEDLE) IMPLANT
NEEDLE HYPO 25X5/8 SAFETYGLIDE (NEEDLE) ×2 IMPLANT
NS IRRIG 1000ML POUR BTL (IV SOLUTION) ×2 IMPLANT
PACK C SECTION WH (CUSTOM PROCEDURE TRAY) ×2 IMPLANT
PAD OB MATERNITY 4.3X12.25 (PERSONAL CARE ITEMS) ×2 IMPLANT
PENCIL SMOKE EVAC W/HOLSTER (ELECTROSURGICAL) ×2 IMPLANT
STRIP CLOSURE SKIN 1/2X4 (GAUZE/BANDAGES/DRESSINGS) ×2 IMPLANT
SUT CHROMIC 0 CTX 36 (SUTURE) ×4 IMPLANT
SUT PLAIN 0 NONE (SUTURE) IMPLANT
SUT PLAIN 2 0 XLH (SUTURE) ×2 IMPLANT
SUT VIC AB 0 CT1 27 (SUTURE) ×5
SUT VIC AB 0 CT1 27XBRD ANBCTR (SUTURE) ×5 IMPLANT
SUT VIC AB 4-0 KS 27 (SUTURE) ×2 IMPLANT
SYR CONTROL 10ML LL (SYRINGE) IMPLANT
TOWEL OR 17X24 6PK STRL BLUE (TOWEL DISPOSABLE) ×2 IMPLANT
TRAY FOLEY CATH SILVER 14FR (SET/KITS/TRAYS/PACK) ×2 IMPLANT

## 2014-10-03 NOTE — Anesthesia Procedure Notes (Signed)
Spinal Patient location during procedure: OB Staffing Anesthesiologist: Stacee Earp Performed by: anesthesiologist  Preanesthetic Checklist Completed: patient identified, site marked, surgical consent, pre-op evaluation, timeout performed, IV checked, risks and benefits discussed and monitors and equipment checked Spinal Block Patient position: sitting Prep: Betadine Patient monitoring: heart rate, continuous pulse ox and blood pressure Approach: midline Location: L3-4 Injection technique: single-shot Needle Needle type: Sprotte  Needle gauge: 24 G Needle length: 9 cm Additional Notes Expiration date of kit checked and confirmed. Patient tolerated procedure well, without complications.  Functioning IV was confirmed and monitors were applied. Sterile prep and drape, including hand hygiene, mask and sterile gloves were used. The patient was positioned and the spine was prepped. The skin was anesthetized with lidocaine.  Free flow of clear CSF was obtained prior to injecting local anesthetic into the CSF.  The spinal needle aspirated freely following injection.  The needle was carefully withdrawn.  The patient tolerated the procedure well. Consent was obtained prior to procedure with all questions answered and concerns addressed. Risks including but not limited to bleeding, infection, nerve damage, paralysis, failed block, inadequate analgesia, allergic reaction, high spinal, itching and headache were discussed and the patient wished to proceed.   Lauretta Grill, MD

## 2014-10-03 NOTE — Op Note (Signed)
NAMETERSEA, AULDS        ACCOUNT NO.:  [id number]  MEDICAL RECORD NO.:  [id number]  LOCATION:  9101                          FACILITY:  [facility]  PHYSICIAN:  Yi Falletta L. Damean Poffenberger, M.D.DATE OF BIRTH:  1980/07/04  DATE OF PROCEDURE:  10/03/2014 DATE OF DISCHARGE:                              OPERATIVE REPORT   PREOPERATIVE DIAGNOSES:  IUP at 35 weeks and 5 days, previous cesarean section, active labor, and lupus.  POSTOPERATIVE DIAGNOSES:  IUP at 35 weeks and 5 days, previous cesarean section, active labor, and lupus.  PROCEDURE:  Repeat low transverse cesarean section.  SURGEON:  Norvil Martensen L. Vincente Poli, M.D.  ANESTHESIA:  Spinal.  ESTIMATED BLOOD LOSS:  Less than 500 mL.  COMPLICATIONS:  None.  DRAINS:  Foley.  PROCEDURE IN DETAIL:  The patient was taken to the operating room.  She was then administered anesthesia, prepped and draped in the usual sterile fashion.  Foley catheter was inserted.  She was prepped and draped and time-out was performed.  A low transverse incision was made, carried down to the fascia.  Fascia scored in midline, extended laterally.  Rectus muscles were separated in the midline.  The peritoneum was entered bluntly.  Peritoneal incision was stretched, and a bladder flap was created sharply and digitally.  The bladder blade was readjusted.  A low transverse incision was made in the uterus.  Amniotic fluid was clear.  The baby was in cephalic presentation, was delivered easily.  It was a female infant, Apgars 8 at 1 minute, 9 at 5 minutes. The cord was clamped and cut.  The placenta was manually removed and noted to be normal and intact with a 3-vessel cord.  The uterus was exteriorized and cleared of all clots and debris.  The uterine incision was closed in 2 layers using 0 chromic in a running, locked stitch.  The uterus was returned to the abdomen.  Irrigation was performed. Hemostasis was noted.  The peritoneum was closed using 0 Vicryl.  The fascia  was closed using 0 Vicryl, starting each corner, meeting in the midline in a running stitch.  The subcutaneous layer was deep, it was closed with plain gut interrupted.  The skin was closed with 4-0 Vicryl on a subcuticular on a Keith needle.  Steri- Strips were applied.  A honeycomb dressing was applied.  All sponge, lap, and instrument counts were correct x2.  The patient went to recovery room in stable condition.     Kenshin Splawn L. Vincente Poli, M.D.     Florestine Avers  D:  10/03/2014  T:  10/03/2014  Job:  [id number]

## 2014-10-03 NOTE — Brief Op Note (Signed)
10/03/2014  8:27 AM  PATIENT:  Kendra Jacobson  34 y.o. female  PRE-OPERATIVE DIAGNOSIS: IUP at 58 w 5 days Labor Previous C Section Lupus  POST-OPERATIVE DIAGNOSIS:   Same  PROCEDURE:  Procedure(s): CESAREAN SECTION (N/A)  SURGEON:  Surgeon(s) and Role:    * Marcelle Overlie, MD - Primary  PHYSICIAN ASSISTANT:   ASSISTANTS: none   ANESTHESIA:   spinal  EBL:  Total I/O In: 2000 [I.V.:2000] Out: 600 [Urine:100; Blood:500]  BLOOD ADMINISTERED:none  DRAINS: Urinary Catheter (Foley)   LOCAL MEDICATIONS USED:  NONE  SPECIMEN:  No Specimen  DISPOSITION OF SPECIMEN:  N/A  COUNTS:  YES  TOURNIQUET:  * No tourniquets in log *  DICTATION: .Other Dictation: Dictation Number [id number]  PLAN OF CARE: Admit to inpatient   PATIENT DISPOSITION:  PACU - hemodynamically stable.   Delay start of Pharmacological VTE agent (>24hrs) due to surgical blood loss or risk of bleeding: yes

## 2014-10-03 NOTE — Anesthesia Postprocedure Evaluation (Signed)
  Anesthesia Post-op Note  Patient: Kendra Jacobson  Procedure(s) Performed: Procedure(s): CESAREAN SECTION (N/A)  Patient Location: Mother/Baby  Anesthesia Type:Epidural  Level of Consciousness: awake and alert   Airway and Oxygen Therapy: Patient Spontanous Breathing  Post-op Pain: mild  Post-op Assessment: Post-op Vital signs reviewed, Patient's Cardiovascular Status Stable, Respiratory Function Stable, No signs of Nausea or vomiting, Pain level controlled, No headache, Spinal receding and Patient able to bend at knees  Post-op Vital Signs: Reviewed  Last Vitals:  Filed Vitals:   10/03/14 1324  BP: 111/69  Pulse: 66  Temp: 36.5 C  Resp: 20    Complications: No apparent anesthesia complications

## 2014-10-03 NOTE — Progress Notes (Signed)
To OR. OK per Dr. Vincente Poli and Dr. Gentry Roch to proceed without lab results.

## 2014-10-03 NOTE — H&P (Signed)
Kendra Jacobson is a 34 y.o. G 3 P 1 at 35 w 5 days. Previous C Section x 1 . Presents in labor and is 6 to 7 cm dilated. Maternal Medical History:  Reason for admission: Contractions.     OB History    Gravida Para Term Preterm AB TAB SAB Ectopic Multiple Living   Past Medical History  Diagnosis Date  . Depression   . Lupus   . Complication of anesthesia     hallucinated with pain medicine after surgery   Past Surgical History  Procedure Laterality Date  . Tonsillectomy  1999  . Cesarean section  04/2009  . Wisdom tooth extraction  2000    x4  . Muscle biopsy Left 09/18/2012    Procedure: MUSCLE BIOPSY;  Surgeon: Lodema Pilot, DO;  Location: [facility];  Service: General;  Laterality: Left;   Family History: family history includes Asthma in her mother; Hyperlipidemia in her father; Hypertension in her mother; Parkinson's disease in her father. Social History:  reports that she has never smoked. She has never used smokeless tobacco. She reports that she does not drink alcohol or use illicit drugs.   Prenatal Transfer Tool  Maternal Diabetes: No Genetic Screening: Normal Maternal Ultrasounds/Referrals: Normal Fetal Ultrasounds or other Referrals:  None Maternal Substance Abuse:  No Significant Maternal Medications:  Meds include: Other: plaquinel and prednisone Significant Maternal Lab Results:  None Other Comments:  None  Review of Systems  All other systems reviewed and are negative.   Dilation: 6.5 Exam by:: Dr Vincente Poli Blood pressure 128/79, pulse 94, temperature 97.6 F (36.4 C), temperature source Oral, resp. rate 16, height  (1.626 m), weight 93.044 kg (205 lb 2 oz), last menstrual period 12/30/2013, SpO2 100 %. Maternal Exam:  Abdomen: Fetal presentation: vertex     Fetal Exam Fetal State Assessment: Category I - tracings are normal.     Physical Exam  Nursing note and vitals reviewed. Constitutional: She appears  well-developed and well-nourished.  HENT:  Head: Normocephalic.  Eyes: Pupils are equal, round, and reactive to light.  Neck: Normal range of motion.  Cardiovascular: Normal rate and regular rhythm.     Prenatal labs: ABO, Rh:   Antibody:   Rubella:   RPR:    HBsAg:    HIV:    GBS:     Assessment/Plan: IUP at 35 w 5 days Labor Previous C Section Lupus Repeat LTCS Risks are reviewed with patient Consent signed   Lavontay Kirk L 10/03/2014, 7:10 AM

## 2014-10-03 NOTE — Anesthesia Preprocedure Evaluation (Addendum)
Anesthesia Evaluation  Patient identified by MRN, date of birth, ID band Patient awake    Reviewed: Allergy & Precautions, NPO status , Patient's Chart, lab work & pertinent test results  History of Anesthesia Complications Negative for: history of anesthetic complications  Airway Mallampati: II  TM Distance: >3 FB Neck ROM: Full    Dental no notable dental hx. (+) Dental Advisory Given   Pulmonary neg pulmonary ROS,  breath sounds clear to auscultation  Pulmonary exam normal       Cardiovascular negative cardio ROS Normal cardiovascular examRhythm:Regular Rate:Normal     Neuro/Psych PSYCHIATRIC DISORDERS Anxiety Depression negative neurological ROS     GI/Hepatic negative GI ROS, Neg liver ROS,   Endo/Other  Obesity, lupus, hx of a muscle biopsy which she reports was inflammation of her muscle related to her lupus, no problems since starting meds  Renal/GU negative Renal ROS  negative genitourinary   Musculoskeletal negative musculoskeletal ROS (+)   Abdominal   Peds negative pediatric ROS (+)  Hematology negative hematology ROS (+)   Anesthesia Other Findings   Reproductive/Obstetrics (+) Pregnancy                             Anesthesia Physical Anesthesia Plan  ASA: III and emergent  Anesthesia Plan: Spinal   Post-op Pain Management:    Induction:   Airway Management Planned:   Additional Equipment:   Intra-op Plan:   Post-operative Plan:   Informed Consent: I have reviewed the patients History and Physical, chart, labs and discussed the procedure including the risks, benefits and alternatives for the proposed anesthesia with the patient or authorized representative who has indicated his/her understanding and acceptance.   Dental advisory given  Plan Discussed with: CRNA  Anesthesia Plan Comments: (She is 7cm dilated and in active labor with prior hx of C/S and a  bulging bag. No time to wait for labs. The MAU was unable to get labs in a timely fashion despite the patient being here for close to an hour. Will proceed with surgery. No hx of transfusion. No hx of bleeding disorders. )      Anesthesia Quick Evaluation

## 2014-10-03 NOTE — MAU Note (Addendum)
Woke up at 3 am having contractions.  Bloody mucus discharge also.  1cm in office on Wednesday.  Scheduled repeat C/S in 3 weeks.  No leaking.  Baby moving but not as much as usual.

## 2014-10-03 NOTE — Transfer of Care (Signed)
Immediate Anesthesia Transfer of Care Note  Patient: Kendra Jacobson  Procedure(s) Performed: Procedure(s): CESAREAN SECTION (N/A)  Patient Location: PACU  Anesthesia Type:Spinal  Level of Consciousness: awake and alert   Airway & Oxygen Therapy: Patient Spontanous Breathing  Post-op Assessment: Report given to RN and Post -op Vital signs reviewed and stable  Post vital signs: Reviewed  Last Vitals:  Filed Vitals:   10/03/14 0851  BP:   Pulse: 64  Temp: 36.3 C  Resp:     Complications: No apparent anesthesia complications

## 2014-10-03 NOTE — Addendum Note (Signed)
Addendum  created 10/03/14 1415 by Angela Adam, CRNA   Modules edited: Notes Section   Notes Section:  File: [id number]

## 2014-10-03 NOTE — Anesthesia Postprocedure Evaluation (Signed)
  Anesthesia Post-op Note  Patient: Kendra Jacobson  Procedure(s) Performed: Procedure(s) (LRB): CESAREAN SECTION (N/A)  Patient Location: PACU  Anesthesia Type: Epidural  Level of Consciousness: awake and alert   Airway and Oxygen Therapy: Patient Spontanous Breathing  Post-op Pain: mild  Post-op Assessment: Post-op Vital signs reviewed, Patient's Cardiovascular Status Stable, Respiratory Function Stable, Patent Airway and No signs of Nausea or vomiting  Last Vitals:  Filed Vitals:   10/03/14 1000  BP: 114/72  Pulse: 70  Temp:   Resp: 16    Post-op Vital Signs: stable   Complications: No apparent anesthesia complications

## 2014-10-03 NOTE — Plan of Care (Signed)
Pt normally takes Imuran 50 mg am  pm- not on mar

## 2014-10-04 LAB — CBC
HCT: 32.9 % — ABNORMAL LOW (ref 36.0–46.0)
Hemoglobin: 11 g/dL — ABNORMAL LOW (ref 12.0–15.0)
MCH: 31.8 pg (ref 26.0–34.0)
MCHC: 33.4 g/dL (ref 30.0–36.0)
MCV: 95.1 fL (ref 78.0–100.0)
PLATELETS: 167 10*3/uL (ref 150–400)
RBC: 3.46 MIL/uL — AB (ref 3.87–5.11)
RDW: 13.5 % (ref 11.5–15.5)
WBC: 9.7 10*3/uL (ref 4.0–10.5)

## 2014-10-04 MED ORDER — RHO D IMMUNE GLOBULIN 1500 UNIT/2ML IJ SOSY
300.0000 ug | PREFILLED_SYRINGE | Freq: Once | INTRAMUSCULAR | Status: AC
  Administered 2014-10-04: 300 ug via INTRAMUSCULAR
  Filled 2014-10-04: qty 2

## 2014-10-04 MED ORDER — LOPERAMIDE HCL 2 MG PO CAPS
4.0000 mg | ORAL_CAPSULE | Freq: Once | ORAL | Status: AC
  Administered 2014-10-04: 4 mg via ORAL
  Filled 2014-10-04: qty 2

## 2014-10-04 NOTE — Progress Notes (Signed)
Pt feels exhausted. Infant to nursery

## 2014-10-04 NOTE — Lactation Note (Addendum)
This note was copied from the chart of Kendra Taquanna Borras. Lactation Consultation Note  Reviewed late preterm behavior and protocol with mother.  Mother is very tired and not feeling well.  Explained CBA, calories, breast milk and attachment to her and how this was the order of importance when feeding baby.  Mom's breasts are heavy. Hand expression reviewed with colostrum visible.  Encouraged mother to pump every 3 hours on the preemie setting.  Mom's right nipple is bruised so a flange was increased to a 27.  Baby fed 15 ml from a bottle during consult because he was subtly cuing but did not root when placed on his mom's chest.  Much encouragement given to mother.  Has handouts on support groups and outpatient services.  Patient Name: Kendra Jacobson [id number] Date: 10/04/2014     Maternal Data    Feeding Feeding Type: Formula  LATCH Score/Interventions                      Lactation Tools Discussed/Used     Consult Status      Soyla Dryer 10/04/2014, 4:57 PM

## 2014-10-04 NOTE — Progress Notes (Signed)
Patient without complaints.  BP 113/78 mmHg  Pulse 75  Temp(Src) 98.5 F (36.9 C) (Oral)  Resp 16  Ht  (1.626 m)  Wt 93.044 kg (205 lb 2 oz)  BMI 35.19 kg/m2  SpO2 93%  LMP 12/30/2013 (Approximate)  Breastfeeding? Unknown Results for orders placed or performed during the hospital encounter of 10/03/14 (from the past 24 hour(s))  CBC     Status: None   Collection Time: 10/03/14  7:23 AM  Result Value Ref Range   WBC 9.3 4.0 - 10.5 K/uL   RBC 4.20 3.87 - 5.11 MIL/uL   Hemoglobin 13.6 12.0 - 15.0 g/dL   HCT 40.9 81.1 - 91.4 %   MCV 93.8 78.0 - 100.0 fL   MCH 32.4 26.0 - 34.0 pg   MCHC 34.5 30.0 - 36.0 g/dL   RDW 78.2 95.6 - 21.3 %   Platelets 226 150 - 400 K/uL  RPR     Status: None   Collection Time: 10/03/14  7:23 AM  Result Value Ref Range   RPR Ser Ql Non Reactive Non Reactive  Type and screen     Status: None (Preliminary result)   Collection Time: 10/03/14  7:23 AM  Result Value Ref Range   ABO/RH(D) A NEG    Antibody Screen POS    Sample Expiration 10/06/2014    Antibody Identification      PASSIVELY ACQUIRED ANTI-D NO CLINICALLY SIGNIFICANT ANTIBODY IDENTIFIED   DAT, IgG NEG    Unit Number [id number]    Blood Component Type RED CELLS,LR    Unit division 00    Status of Unit ALLOCATED    Transfusion Status OK TO TRANSFUSE    Crossmatch Result COMPATIBLE    Unit Number [id number]    Blood Component Type RED CELLS,LR    Unit division 00    Status of Unit ALLOCATED    Transfusion Status OK TO TRANSFUSE    Crossmatch Result COMPATIBLE   Urinalysis, Routine w reflex microscopic (not at [facility])     Status: Abnormal   Collection Time: 10/03/14  9:30 AM  Result Value Ref Range   Color, Urine YELLOW YELLOW   APPearance CLEAR CLEAR   Specific Gravity, Urine 1.015 1.005 - 1.030   pH 7.0 5.0 - 8.0   Glucose, UA NEGATIVE NEGATIVE mg/dL   Hgb urine dipstick NEGATIVE NEGATIVE   Bilirubin Urine NEGATIVE NEGATIVE   Ketones, ur 15 (A) NEGATIVE mg/dL   Protein,  ur NEGATIVE NEGATIVE mg/dL   Urobilinogen, UA 0.2 0.0 - 1.0 mg/dL   Nitrite NEGATIVE NEGATIVE   Leukocytes, UA NEGATIVE NEGATIVE  CBC     Status: Abnormal   Collection Time: 10/04/14  5:35 AM  Result Value Ref Range   WBC 9.7 4.0 - 10.5 K/uL   RBC 3.46 (L) 3.87 - 5.11 MIL/uL   Hemoglobin 11.0 (L) 12.0 - 15.0 g/dL   HCT 25.3 (L) 66.4 - 40.3 %   MCV 95.1 78.0 - 100.0 fL   MCH 31.8 26.0 - 34.0 pg   MCHC 33.4 30.0 - 36.0 g/dL   RDW 47.4 25.9 - 56.3 %   Platelets 167 150 - 400 K/uL   Abdomen is soft and non tender Incision clean dry and intact  IMPRESSION: POD #1 C Section Lupus  PLAN: Routine care Continue lupus medications Circ today if ok with peds - consented

## 2014-10-05 ENCOUNTER — Encounter (HOSPITAL_COMMUNITY): Payer: Self-pay | Admitting: Obstetrics and Gynecology

## 2014-10-05 LAB — RH IG WORKUP (INCLUDES ABO/RH)
ABO/RH(D): A NEG
Gestational Age(Wks): 35.5
Unit division: 0

## 2014-10-05 NOTE — Lactation Note (Signed)
This note was copied from the chart of Kendra Jacobson. Lactation Consultation Note  Patient Name: Kendra Jacobson ZOXWR'U Date: 10/05/2014 Reason for consult: Follow-up assessment;Infant < 6lbs;Late preterm infant Mom had questions about meds she takes for Lupus - Imuran L3 per Sheffield Slider, Plaquenil L2 per Heloise Beecham L2 per Sheffield Slider. Mom also may re-start Cymbalta - L3 per Sheffield Slider. Copied information from [facility] "Medications and Mother's Milk" reviewed with and gave these to Mom. Discussed precautions and advised to discuss with Peds.   Maternal Data    Feeding    LATCH Score/Interventions                      Lactation Tools Discussed/Used     Consult Status Consult Status: Follow-up Date: 10/06/14 Follow-up type: In-patient    Alfred Levins 10/05/2014, 2:55 PM

## 2014-10-05 NOTE — Lactation Note (Signed)
This note was copied from the chart of Kendra Jacobson. Lactation Consultation Note  Patient Name: Kendra Diamonique Ruedas ZOXWR'U Date: 10/05/2014   Visited with Mom, baby 42 hrs old.  Baby continues to go to breast (well per Mom), and then receives bottle of formula (22 ml last feeding).  Mom has not pumped today.  Discussed importance of double pumping with regards to supporting her milk supply.  Encouraged her to allow family to feed baby the supplement while she pumps.  Talked about obtaining a pump at discharge.  Mom to be given copies of information on medication she is taking this afternoon.  Encouraged skin to skin, and feeding at 3 hrs or more often.  Supplement amount 20-30 ml per feeding.  To follow up in am and prn.   Judee Clara 10/05/2014, 12:29 PM

## 2014-10-05 NOTE — Progress Notes (Signed)
Subjective: Postpartum Day 2: Cesarean Delivery Patient reports tolerating PO, + BM and no problems voiding.    Objective: Vital signs in last 24 hours: Temp:  [98.2 F (36.8 C)-98.3 F (36.8 C)] 98.3 F (36.8 C) (08/29 0510) Pulse Rate:  [69-93] 93 (08/29 0510) Resp:  [14-18] 18 (08/29 0510) BP: (103-125)/(57-79) 125/79 mmHg (08/29 0510) SpO2:  [98 %] 98 % (08/28 1811)  Physical Exam:  General: alert and cooperative Lochia: appropriate Uterine Fundus: firm Incision: healing well DVT Evaluation: No evidence of DVT seen on physical exam. Negative Homan's sign. No cords or calf tenderness. No significant calf/ankle edema.   Recent Labs  10/03/14 0723 10/04/14 0535  HGB 13.6 11.0*  HCT 39.4 32.9*    Assessment/Plan: Status post Cesarean section. Doing well postoperatively.  Continue current care.  Kendra Jacobson G 10/05/2014, 8:09 AM

## 2014-10-05 NOTE — Clinical Social Work Maternal (Signed)
CLINICAL SOCIAL WORK MATERNAL/CHILD NOTE  Patient Details  Name: Kendra Jacobson MRN: [id number] Date of Birth: 03/24/1980  Date:  07-28-14  Clinical Social Worker Initiating Note:  Loleta Books, LCSW Date/ Time Initiated:  10/05/14/1000     Child's Name:  Jean Rosenthal   Legal Guardian:  Tessie Eke and Nicoletta Dress (parents)  Need for Interpreter:  None   Date of Referral:  March 14, 2014     Reason for Referral:  History of depression  Referral Source:  [facility]   Address:  [address] [place], [place] [address]  Phone number:  [phone]   Household Members:  Minor Children, Spouse   Natural Supports (not living in the home):  Immediate Family, Extended Family   Professional Supports: None   Employment: Homemaker   Type of Work: 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 MOB was previously prescribed Cymbalta, but discontinued medication with the pregnancy. MOB expressed interest in re-starting medication postpartum.    Cognitive State:  Able to Concentrate , Alert , Goal Oriented , Insightful , Linear Thinking    Mood/Affect:  Euthymic , Comfortable , Calm    CSW Assessment:  CSW received request for consult due to MOB presenting with a history of depression. MOB provided consent for her mother to remain in the room during the assessment.  MOB presented in a pleasant mood, affect congruent to the setting. MOB was noted to be easily engaged and receptive to the visit.    CSW provided psychosocial support and assisted the MOB to process her thoughts and feelings secondary to the  infant's birth and her transition postpartum. MOB continues to report feeling that the infant's birth has been a "whirlwind" since he was born earlier than his due date. She discussed the stress associated with not having the home fully prepared for the infant, and not having any "premie" size diapers and clothing since she had not anticipated him to be as small as he is.  MOB discussed additional feelings secondary to missing her other child's first day of kindergarten, and the feelings of loss that she felt when she saw his first day of school pictures.  The MOB's mother expressed concern about the MOB's risk for postpartum depression due to these events and how MOB has been coping.  The MOB confirmed that 8/28 was "overwhelming" due to having numerous visitors and being exhausted.    MOB reported that she was diagnosed with lupus in 2012, and discussed concurrent symptoms of depression as she adjusted to her new "normal". She shared that shortly after her diagnosis of lupus, her family noted that she was more irritable and "was off".  Per MOB, her doctor prescribed her Cymbalta to assist with symptoms of lupus and her mood, and felt that it helped. MOB's mother confirmed that MOB's mood stabilized with the assistance of Cymbalta.  Cymbalta was discontinued when MOB learned of the pregnancy, and she stated, "I've been told that I've become more irritable".  She continued to discuss how she has received feedback from her family related to how her mood has changed since the medication was discontinued.  CSW provided education on perinatal mood and anxiety disorders, including the difference between the [facility] and PMAD.  MOB acknowledged her increased risk due to her prior history and her symptoms during the pregnancy. MOB and MOB's mother presented as interested and motivated to create a plan to support the MOB's mental health postpartum.  MOB expressed interest in re-starting Cymbalta since she knows that  it has worked in the past; however, she stated that she is also interested in breastfeeding and would rather try another medication that is safe to take while breastfeeding if she cannot take Cymbalta.  CSW agreed to consult with [facility] regarding safety of the medication, and to return with information.  MOB and MOB's mother expressed appreciation, and were agreeable to ongoing collaboration with CSW, LC, and OB provider to determine medications to support MOB's mental health.   CSW Plan/Description:   1)Patient/Family Education: Perinatal mood and anxiety disorders  2) CSW to collaborate with LC and MOB's OB provider to discuss medications to address increased risk for PPD. 3)No Further Intervention Required/No Barriers to Discharge    Kelby Fam October 23, 2014, 1:06 PM

## 2014-10-06 ENCOUNTER — Ambulatory Visit: Payer: Self-pay

## 2014-10-06 MED ORDER — OXYCODONE-ACETAMINOPHEN 5-325 MG PO TABS: 1.0000 | ORAL_TABLET | ORAL | Status: DC | PRN

## 2014-10-06 MED ORDER — AZATHIOPRINE 50 MG PO TABS: 100.0000 mg | ORAL_TABLET | Freq: Every day | ORAL | Status: DC

## 2014-10-06 MED ORDER — IBUPROFEN 600 MG PO TABS: 600.0000 mg | ORAL_TABLET | Freq: Four times a day (QID) | ORAL | Status: DC

## 2014-10-06 NOTE — Lactation Note (Signed)
This note was copied from the chart of Kendra Madalin Hughart. Lactation Consultation Note  Patient Name: Kendra Jacobson Date: 10/06/2014 Reason for consult: Follow-up assessment;Late preterm infant;Infant < 6lbs Mom latches baby without assist. Made slight adjustment for more depth. Baby demonstrates a good suckling pattern with few noted swallows. Mom to continue to limit time at breast to 30 minutes, continue to supplement per LPT guidelines and to post pump during the day to encourage milk production and to protect milk supply. Mom has DEBP at home. Encouraged Mom to make OP f/u, she will call to schedule. Engorgement care reviewed if needed. Encouraged to talk with Peds about Meds for Lupus. Encouraged to call for questions/concerns.   Maternal Data    Feeding Feeding Type: Breast Fed Length of feed: 25 min  LATCH Score/Interventions Latch: Grasps breast easily, tongue down, lips flanged, rhythmical sucking.  Audible Swallowing: A few with stimulation  Type of Nipple: Everted at rest and after stimulation  Comfort (Breast/Nipple): Soft / non-tender     Hold (Positioning): No assistance needed to correctly position infant at breast. Intervention(s): Support Pillows;Position options;Breastfeeding basics reviewed;Skin to skin  LATCH Score: 9  Lactation Tools Discussed/Used     Consult Status Consult Status: Complete Date: 10/06/14 Follow-up type: In-patient    Kendra Jacobson 10/06/2014, 1:55 PM

## 2014-10-06 NOTE — Discharge Summary (Signed)
Obstetric Discharge Summary Reason for Admission: onset of labor Prenatal Procedures: ultrasound Intrapartum Procedures: cesarean: low cervical, transverse Postpartum Procedures: none Complications-Operative and Postpartum: none HEMOGLOBIN  Date Value Ref Range Status  10/04/2014 11.0* 12.0 - 15.0 g/dL Final   HCT  Date Value Ref Range Status  10/04/2014 32.9* 36.0 - 46.0 % Final    Physical Exam:  General: alert and cooperative Lochia: appropriate Uterine Fundus: firm Incision: healing well DVT Evaluation: No evidence of DVT seen on physical exam. Negative Homan's sign. No cords or calf tenderness. No significant calf/ankle edema.  Discharge Diagnoses: Term Pregnancy-delivered , Lupus Discharge Information: Date: 10/06/2014 Activity: pelvic rest Diet: routine Medications: PNV, Ibuprofen, Percocet and Placquenil Imuran and predisone Condition: stable Instructions: refer to practice specific booklet Discharge to: home   Newborn Data: Live born female  Birth Weight: 5 lb 9.2 oz (2530 g) APGAR: 8, 9  Home with mother.  Kendra Jacobson G 10/06/2014, 7:52 AM

## 2014-10-06 NOTE — Progress Notes (Signed)
CSW followed up with MOB prior to discharge in order to continue provide support as she transitions postpartum.  MOB continues to process her feelings as she prepares to go home since she is worried about caring for two children and caring for a preterm infant.  MOB described feelings of anxiety, but continued to report feeling that she will be "okay".   MOB stated that she has a follow up appointment with her OB provider in one week and will discuss re-starting Cymbalta at that time since she does not believe that she is in a state of readiness to make the decision at this time.

## 2014-10-07 LAB — TYPE AND SCREEN
ABO/RH(D): A NEG
Antibody Screen: POSITIVE
DAT, IgG: NEGATIVE
UNIT DIVISION: 0
Unit division: 0
Unit division: 0
Unit division: 0

## 2014-10-27 ENCOUNTER — Encounter (HOSPITAL_COMMUNITY): Admission: RE | Payer: Self-pay | Source: Ambulatory Visit

## 2014-10-27 ENCOUNTER — Inpatient Hospital Stay (HOSPITAL_COMMUNITY): Admission: RE | Admit: 2014-10-27 | Payer: 59 | Source: Ambulatory Visit | Admitting: Obstetrics and Gynecology

## 2014-10-27 SURGERY — Surgical Case
Anesthesia: Regional

## 2015-05-14 ENCOUNTER — Ambulatory Visit (INDEPENDENT_AMBULATORY_CARE_PROVIDER_SITE_OTHER): Payer: BLUE CROSS/BLUE SHIELD | Admitting: Family Medicine

## 2015-05-14 ENCOUNTER — Encounter: Payer: Self-pay | Admitting: Family Medicine

## 2015-05-14 VITALS — BP 110/78 | HR 96 | Temp 97.8°F | Resp 18 | Wt 177.0 lb

## 2015-05-14 DIAGNOSIS — J0100 Acute maxillary sinusitis, unspecified: Secondary | ICD-10-CM | POA: Diagnosis not present

## 2015-05-14 MED ORDER — AMOXICILLIN 875 MG PO TABS: 875.0000 mg | ORAL_TABLET | Freq: Two times a day (BID) | ORAL | Status: DC

## 2015-05-14 MED ORDER — TRIAMCINOLONE ACETONIDE 55 MCG/ACT NA AERO: 2.0000 | INHALATION_SPRAY | Freq: Every day | NASAL | Status: DC

## 2015-05-14 NOTE — Progress Notes (Signed)
Patient ID: Kendra Jacobson, female   DOB: 02/19/1980, 35 y.o.   MRN: [id number]    Subjective:    Patient ID: Kendra Jacobson, female    DOB: 01/01/1981, 35 y.o.   MRN: [id number]  Patient presents for c/o sinus infection Patient here with worsening sinus pressure drainage, just her regular allergy so she was still felt treating her allergies for the past few weeks. Now she has thick greenish discharge pressure over both maxillary sinuses she has not had any fever. No significant cough. She is on immunosuppressant for her lupus. She is currently breast-feeding her 2970-month-old    Review Of Systems:  GEN- denies fatigue, fever, weight loss,weakness, recent illness HEENT- denies eye drainage, change in vision, +nasal discharge, CVS- denies chest pain, palpitations RESP- denies SOB, cough, wheeze ABD- denies N/V, change in stools, abd pain GU- denies dysuria, hematuria, dribbling, incontinence MSK- denies joint pain, muscle aches, injury Neuro- denies headache, dizziness, syncope, seizure activity       Objective:    BP 110/78 mmHg  Pulse 96  Temp(Src) 97.8 F (36.6 C) (Oral)  Resp 18  Wt 177 lb (80.287 kg)  Breastfeeding? Yes GEN- NAD, alert and oriented x3 HEENT- PERRL, EOMI, non injected sclera, pink conjunctiva, MMM, oropharynx clear TM clear bilat no effusion,  + maxillary sinus tenderness, inflammed turbinates,  Nasal drainage  Neck- Supple, no LAD CVS- RRR, no murmur RESP-CTAB EXT- No edema Pulses- Radial 2+         Assessment & Plan:      Problem List Items Addressed This Visit    None    Visit Diagnoses    Acute maxillary sinusitis, recurrence not specified    -  Primary    Amox BID, continue nasal saline and nasocort    Relevant Medications    amoxicillin (AMOXIL) 875 MG tablet    triamcinolone (NASACORT AQ) 55 MCG/ACT AERO nasal inhaler       Note: This dictation was prepared with Dragon dictation along with smaller phrase technology. Any  transcriptional errors that result from this process are unintentional.

## 2015-11-24 ENCOUNTER — Ambulatory Visit (INDEPENDENT_AMBULATORY_CARE_PROVIDER_SITE_OTHER): Payer: BLUE CROSS/BLUE SHIELD | Admitting: Family Medicine

## 2015-11-24 DIAGNOSIS — Z23 Encounter for immunization: Secondary | ICD-10-CM

## 2017-11-16 ENCOUNTER — Ambulatory Visit (INDEPENDENT_AMBULATORY_CARE_PROVIDER_SITE_OTHER): Payer: BLUE CROSS/BLUE SHIELD

## 2017-11-16 DIAGNOSIS — Z23 Encounter for immunization: Secondary | ICD-10-CM | POA: Diagnosis not present

## 2017-11-16 NOTE — Progress Notes (Signed)
Patient was in office to receive her flu vaccine.Patient received the vaccine in her right deltoid.Patient tolerated well  

## 2018-04-22 ENCOUNTER — Other Ambulatory Visit: Payer: Self-pay | Admitting: Obstetrics and Gynecology

## 2018-04-22 DIAGNOSIS — Z9189 Other specified personal risk factors, not elsewhere classified: Secondary | ICD-10-CM

## 2019-04-13 ENCOUNTER — Ambulatory Visit: Payer: Self-pay | Attending: Internal Medicine

## 2019-04-13 DIAGNOSIS — Z23 Encounter for immunization: Secondary | ICD-10-CM | POA: Insufficient documentation

## 2019-04-13 NOTE — Progress Notes (Signed)
   Covid-19 Vaccination Clinic  Name:  Kendra Jacobson    MRN: [id number] DOB: [birth year]  04/13/2019  [name] was observed post Covid-19 immunization for 15 minutes without incident. She was provided with Vaccine Information Sheet and instruction to access the V-Safe system.   [name] was instructed to call 911 with any severe reactions post vaccine: Marland Kitchen Difficulty breathing  . Swelling of face and throat  . A fast heartbeat  . A bad rash all over body  . Dizziness and weakness   Immunizations Administered    Name Date Dose VIS Date Route   Pfizer COVID-19 Vaccine 04/13/2019  4:17 PM 0.3 mL 01/17/2019 Intramuscular   Manufacturer: [employer], [employer]   Lot: [id number]   NDC: [address]-9

## 2019-05-06 ENCOUNTER — Ambulatory Visit: Payer: BLUE CROSS/BLUE SHIELD | Attending: Internal Medicine

## 2019-05-06 DIAGNOSIS — Z23 Encounter for immunization: Secondary | ICD-10-CM

## 2019-05-06 NOTE — Progress Notes (Signed)
   Covid-19 Vaccination Clinic  Name:  ROBYNNE ROAT    MRN: [id number] DOB: December 22, 1980  05/06/2019  [name] was observed post Covid-19 immunization for 15 minutes without incident. She was provided with Vaccine Information Sheet and instruction to access the V-Safe system.   Ms. Aguinaga was instructed to call 911 with any severe reactions post vaccine: Marland Kitchen Difficulty breathing  . Swelling of face and throat  . A fast heartbeat  . A bad rash all over body  . Dizziness and weakness   Immunizations Administered    Name Date Dose VIS Date Route   Pfizer COVID-19 Vaccine 05/06/2019  4:09 PM 0.3 mL 01/17/2019 Intramuscular   Manufacturer: [employer], [employer]   Lot: [id number]   NDC: [address]-6

## 2019-07-28 ENCOUNTER — Other Ambulatory Visit: Payer: Self-pay

## 2019-07-28 ENCOUNTER — Telehealth (INDEPENDENT_AMBULATORY_CARE_PROVIDER_SITE_OTHER): Payer: BC Managed Care – PPO | Admitting: Family Medicine

## 2019-07-28 DIAGNOSIS — J40 Bronchitis, not specified as acute or chronic: Secondary | ICD-10-CM

## 2019-07-28 DIAGNOSIS — M329 Systemic lupus erythematosus, unspecified: Secondary | ICD-10-CM

## 2019-07-28 MED ORDER — ALBUTEROL SULFATE HFA 108 (90 BASE) MCG/ACT IN AERS: 2.0000 | INHALATION_SPRAY | Freq: Four times a day (QID) | RESPIRATORY_TRACT | 0 refills | Status: DC | PRN

## 2019-07-28 MED ORDER — AZITHROMYCIN 250 MG PO TABS: ORAL_TABLET | ORAL | 0 refills | Status: DC

## 2019-07-28 NOTE — Progress Notes (Addendum)
Subjective:    Patient ID: Kendra Jacobson, female    DOB: [birth year], 39 y.o.   MRN: [id number]  HPI  Patient is a 39 year old Caucasian female who is being seen today as a telephone/video visit.  She consents to be seen over the telephone.  She is at home and I am in my office. Phone call began at [phone].  Phone call concluded at 853.  Patient has a past medical history of lupus.  She is currently on prednisone, Imuran, and Plaquenil.  Therefore she is immunosuppressed.  She had her Covid shot and was fully vaccinated in March.  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  She states that for the last 2 to 3 weeks she has been dealing with chest congestion.  Over the last 10 days this is steadily worsened.  She reports rhonchorous breath sounds in both lungs.  She reports expiratory wheezing particularly pronounced when she is lying down at night.  She reports audible crackles in her airways when she is breathing especially supine.  She has been taking Mucinex DM with no relief.  She has mild shortness of breath with activity.  Mild bilateral pleurisy which is all over her lungs and not isolated to one area.  She denies any hemoptysis.  She denies any purulent sputum.  She denies any runny nose, sore throat, nausea, vomiting. Past Medical History:  Diagnosis Date  . Complication of anesthesia    hallucinated with pain medicine after surgery  . Depression   . Lupus    Dr.Beek[facility]   Past Surgical History:  Procedure Laterality Date  . CESAREAN SECTION  04/2009  . CESAREAN SECTION N/A 10/03/2014   Procedure: CESAREAN SECTION;  Surgeon: Dian Queen, MD;  Location: [facility];  Service: Obstetrics;  Laterality: N/A;  . MUSCLE BIOPSY Left 09/18/2012   Procedure: MUSCLE BIOPSY;  Surgeon: Madilyn Hook, DO;  Location: [facility];  Service: General;  Laterality: Left;  . TONSILLECTOMY  1999  . WISDOM TOOTH EXTRACTION  2000   x4   Current Outpatient Medications on File  Prior to Visit  Medication Sig Dispense Refill  . amoxicillin (AMOXIL) 875 MG tablet Take 1 tablet (875 mg total) by mouth 2 (two) times daily. 20 tablet 0  . azaTHIOprine (IMURAN) 50 MG tablet Take 50-100 mg by mouth daily. 100 mg AM, 50 mg PM  3  . hydroxychloroquine (PLAQUENIL) 200 MG tablet Take 400 mg by mouth every evening.     Marland Kitchen ibuprofen (ADVIL,MOTRIN) 600 MG tablet Take 1 tablet (600 mg total) by mouth every 6 (six) hours. 30 tablet 1  . predniSONE (DELTASONE) 5 MG tablet Take 5 mg by mouth daily with breakfast.    . Prenatal Vit-Fe Fumarate-FA (PRENATAL MULTIVITAMIN) TABS tablet Take 1 tablet by mouth daily at 12 noon. Reported on 05/14/2015    . triamcinolone (NASACORT AQ) 55 MCG/ACT AERO nasal inhaler Place 2 sprays into the nose daily. 1 Inhaler 12  . venlafaxine XR (EFFEXOR-XR) 75 MG 24 hr capsule Take 75 mg by mouth daily.  4   No current facility-administered medications on file prior to visit.   Allergies  Allergen Reactions  . Erythromycin Other (See Comments)    Upset stomach    Social History   Socioeconomic History  . Marital status: Married    Spouse name: Not on file  . Number of children: Not on file  . Years of education: Not on file  . Highest education level:  Not on file  Occupational History  . Not on file  Tobacco Use  . Smoking status: Never Smoker  . Smokeless tobacco: Never Used  Substance and Sexual Activity  . Alcohol use: No  . Drug use: No  . Sexual activity: Not Currently    Birth control/protection: None  Other Topics Concern  . Not on file  Social History Narrative  . Not on file   Social Determinants of Health   Financial Resource Strain:   . Difficulty of Paying Living Expenses:   Food Insecurity:   . Worried About Programme researcher, broadcasting/film/video in the Last Year:   . Barista in the Last Year:   Transportation Needs:   . Freight forwarder (Medical):   Marland Kitchen Lack of Transportation (Non-Medical):   Physical Activity:   . Days of  Exercise per Week:   . Minutes of Exercise per Session:   Stress:   . Feeling of Stress :   Social Connections:   . Frequency of Communication with Friends and Family:   . Frequency of Social Gatherings with Friends and Family:   . Attends Religious Services:   . Active Member of Clubs or Organizations:   . Attends Banker Meetings:   Marland Kitchen Marital Status:   Intimate Partner Violence:   . Fear of Current or Ex-Partner:   . Emotionally Abused:   Marland Kitchen Physically Abused:   . Sexually Abused:      Review of Systems  All other systems reviewed and are negative.      Objective:   Physical Exam        Assessment & Plan:  Bronchitis with acute wheezing  Lupus (HCC)  Patient has had symptoms now for 3 weeks.  Given the duration of her symptoms as well as her immunocompromise status I have recommended treating for possible bacterial bronchitis with a Z-Pak.  She can also use albuterol 2 puffs every 6 hours as needed for wheezing and coughing as I suspect that she has an element of bronchospasm due to the bronchitis.  She states that at times it feels like she has an elephant on her chest when she is wheezing and I believe the albuterol will help with that.  She denies any leg swelling or unilateral edema that would suggest a DVT and she has no history of pulmonary embolism.  If symptoms or not improving over the next 3 to 4 days, I would recommend a chest x-ray to rule out community-acquired pneumonia.

## 2020-02-25 ENCOUNTER — Encounter: Payer: Self-pay | Admitting: Family Medicine

## 2020-02-26 ENCOUNTER — Other Ambulatory Visit: Payer: Self-pay | Admitting: Family Medicine

## 2020-02-26 MED ORDER — SULFAMETHOXAZOLE-TRIMETHOPRIM 800-160 MG PO TABS: 1.0000 | ORAL_TABLET | Freq: Two times a day (BID) | ORAL | 0 refills | Status: DC

## 2020-07-02 ENCOUNTER — Other Ambulatory Visit (HOSPITAL_BASED_OUTPATIENT_CLINIC_OR_DEPARTMENT_OTHER): Payer: Self-pay

## 2020-07-02 DIAGNOSIS — R0683 Snoring: Secondary | ICD-10-CM

## 2020-07-22 ENCOUNTER — Encounter: Payer: Self-pay | Admitting: Family Medicine

## 2020-07-22 ENCOUNTER — Other Ambulatory Visit: Payer: Self-pay | Admitting: Family Medicine

## 2020-07-22 MED ORDER — AMOXICILLIN-POT CLAVULANATE 875-125 MG PO TABS: 1.0000 | ORAL_TABLET | Freq: Two times a day (BID) | ORAL | 0 refills | Status: DC

## 2020-09-07 ENCOUNTER — Ambulatory Visit (HOSPITAL_BASED_OUTPATIENT_CLINIC_OR_DEPARTMENT_OTHER): Payer: BC Managed Care – PPO | Attending: Otolaryngology | Admitting: Internal Medicine

## 2020-09-07 ENCOUNTER — Other Ambulatory Visit: Payer: Self-pay

## 2020-09-07 VITALS — Ht 64.0 in | Wt 185.0 lb

## 2020-09-07 DIAGNOSIS — Z79899 Other long term (current) drug therapy: Secondary | ICD-10-CM | POA: Diagnosis not present

## 2020-09-07 DIAGNOSIS — Z7952 Long term (current) use of systemic steroids: Secondary | ICD-10-CM | POA: Insufficient documentation

## 2020-09-07 DIAGNOSIS — R0683 Snoring: Secondary | ICD-10-CM | POA: Diagnosis not present

## 2020-09-18 DIAGNOSIS — R0683 Snoring: Secondary | ICD-10-CM

## 2020-09-18 NOTE — Procedures (Signed)
   Patient Name: Kendra Jacobson, Kendra Jacobson Date: 09/07/2020 Gender: Female D.O.B: [birth year] Age (years): 40 Referring Provider: Newman Pies Height (inches): 64 Interpreting Physician: Jetty Duhamel MD, ABSM Weight (lbs): 185 RPSGT: Ulyess Mort BMI: 32 MRN: [id number] Neck Size: 13.50  CLINICAL INFORMATION Sleep Study Type: NPSG Indication for sleep study: Daytime Fatigue, Fatigue, Obesity, Snoring Epworth Sleepiness Score: 8  SLEEP STUDY TECHNIQUE As per the AASM Manual for the Scoring of Sleep and Associated Events v2.3 (April 2016) with a hypopnea requiring 4% desaturations.  The channels recorded and monitored were frontal, central and occipital EEG, electrooculogram (EOG), submentalis EMG (chin), nasal and oral airflow, thoracic and abdominal wall motion, anterior tibialis EMG, snore microphone, electrocardiogram, and pulse oximetry.  MEDICATIONS Medications self-administered by patient taken the night of the study : DULOXETINE, AZATHIOPRINE, PLAQUENIL, GABAPENTIN, JUNEL BC, PREDNISONE, ZYRTEC  SLEEP ARCHITECTURE The study was initiated at 10:45:44 PM and ended at 4:55:36 AM.  Sleep onset time was 68.4 minutes and the sleep efficiency was 64.9%%. The total sleep time was 239.9 minutes.  Stage REM latency was N/A minutes.  The patient spent 13.5%% of the night in stage N1 sleep, 86.5%% in stage N2 sleep, 0.0%% in stage N3 and 0% in REM.  Alpha intrusion was absent.  Supine sleep was 55.78%.  RESPIRATORY PARAMETERS The overall apnea/hypopnea index (AHI) was 0.8 per hour. There were 1 total apneas, including 0 obstructive, 1 central and 0 mixed apneas. There were 2 hypopneas and 106 RERAs.  The AHI during Stage REM sleep was N/A per hour.  AHI while supine was 0.9 per hour.  The mean oxygen saturation was 95.4%. The minimum SpO2 during sleep was 93.0%.  moderate snoring was noted during this study.  CARDIAC DATA The 2 lead EKG demonstrated sinus rhythm. The mean  heart rate was 86.0 beats per minute. Other EKG findings include: None.  LEG MOVEMENT DATA The total PLMS were 0 with a resulting PLMS index of 0.0. Associated arousal with leg movement index was 0.5 .  IMPRESSIONS - No significant obstructive sleep apnea occurred during this study (AHI = 0.8/h). - No significant central sleep apnea occurred during this study (CAI = 0.3/h). - The patient had minimal or no oxygen desaturation during the study (Min O2 = 93.0%) - The patient snored with moderate snoring volume. - No cardiac abnormalities were noted during this study. - Clinically significant periodic limb movements did not occur during sleep. No significant associated arousals.  DIAGNOSIS - Primary Snoring  RECOMMENDATIONS - Manage for symptoms based on clinical judgment. - Sleep hygiene should be reviewed to assess factors that may improve sleep quality. - Weight management and regular exercise should be initiated or continued if appropriate.  [Electronically signed] 09/18/2020 10:46 AM  Jetty Duhamel MD, ABSM Diplomate, American Board of Sleep Medicine   NPI: [id number]                          Jetty Duhamel Diplomate, American Board of Sleep Medicine  ELECTRONICALLY SIGNED ON:  09/18/2020, 10:43 AM [facility] PH: [phone]   FX: [phone] ACCREDITED BY THE AMERICAN ACADEMY OF SLEEP MEDICINE

## 2020-10-16 ENCOUNTER — Ambulatory Visit
Admission: RE | Admit: 2020-10-16 | Discharge: 2020-10-16 | Disposition: A | Discharge status: Home / Self Care | Payer: BC Managed Care – PPO | Source: Ambulatory Visit | Attending: Family Medicine | Admitting: Family Medicine

## 2020-10-16 ENCOUNTER — Other Ambulatory Visit: Payer: Self-pay

## 2020-10-16 VITALS — BP 159/97 | HR 96 | Temp 98.0°F | Resp 18 | Ht 64.0 in | Wt 185.0 lb

## 2020-10-16 DIAGNOSIS — I10 Essential (primary) hypertension: Secondary | ICD-10-CM | POA: Diagnosis not present

## 2020-10-16 DIAGNOSIS — R0789 Other chest pain: Secondary | ICD-10-CM | POA: Diagnosis not present

## 2020-10-16 NOTE — ED Provider Notes (Signed)
[facility]    CSN: [id number] Arrival date & time: 10/16/20  0071      History   Chief Complaint Chief Complaint  Patient presents with   Hypertension    HPI Kendra Jacobson is a 40 y.o. female.   HPI  40 year old female here for evaluation of elevated blood pressure.  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  Three days later she woke up and her leg was swollen, red, and it was too painful to walk on.  She was evaluated at a walk-in clinic where she was diagnosed with cellulitis and put on antibiotics.  She was reevaluated 4 days ago and the redness and swelling in her right lower leg had resolved but her blood pressure was elevated.  She states that the treatment for her cellulitis was 5-day burst dose of prednisone and antibiotics coupled with elevation and nonweightbearing on her right leg.  She states that when she went to the clinic she was concerned about a blood clot but she was not checked at that time.  Patient does have a history of lupus and is on 5 mg of prednisone daily as maintenance.  Patient reports that 2 days ago she had the worst headache of her life that lasted all day and into the night and she rated it an 8/10 that was in the back of her head, neck, and shoulders.  She took Advil which did not help with the pain.  The headache eventually resolved but it was associated with nausea and light sensitivity.  Patient states that she developed substernal chest pressure yesterday that is nonradiating and is currently a 3/10.  At maximum it was a 5-6 over 10.  She denies any changes in vision.  She did not have a head injury or loss of consciousness when she fell, the chest pressure is nonradiating, it is not associated with shortness of breath at this time or sweating.  Past Medical History:  Diagnosis Date   Complication of anesthesia    hallucinated with pain  medicine after surgery   Depression    Lupus [facility])    Dr.[facility]    Patient Active Problem List   Diagnosis Date Noted   Snoring 09/07/2020   S/P cesarean section 10/03/2014   Depression 04/23/2013   Lupus (HCC) 04/23/2013    Past Surgical History:  Procedure Laterality Date   CESAREAN SECTION  04/2009   CESAREAN SECTION N/A 10/03/2014   Procedure: CESAREAN SECTION;  Surgeon: Marcelle Overlie, MD;  Location: [facility];  Service: Obstetrics;  Laterality: N/A;   MUSCLE BIOPSY Left 09/18/2012   Procedure: MUSCLE BIOPSY;  Surgeon: Lodema Pilot, DO;  Location: [facility];  Service: General;  Laterality: Left;   TONSILLECTOMY  1999   WISDOM TOOTH EXTRACTION  2000   x4    OB History     Gravida  3   Para  2   Term  1   Preterm  1   AB  1   Living  2      SAB  1   IAB      Ectopic      Multiple  0   Live Births  2            Home Medications    Prior to Admission medications   Medication Sig Start Date End Date Taking? Authorizing Provider  albuterol (VENTOLIN HFA) 108 (90 Base) MCG/ACT inhaler Inhale 2 puffs into the lungs every 6 (six) hours as needed for wheezing or shortness of breath. 07/28/19  Yes Pickard, Priscille Heidelberg, MD  azaTHIOprine (IMURAN) 50 MG tablet Take 150 mg by mouth Nightly.   Yes [provider]  doxycycline (VIBRAMYCIN) 100 MG capsule Take 100 mg by mouth in the morning and at bedtime. 10/08/20 10/18/20 Yes [provider]  gabapentin (NEURONTIN) 100 MG capsule Take 100 mg by mouth at bedtime. 09/03/20  Yes [provider]  hydroxychloroquine (PLAQUENIL) 200 MG tablet Take 400 mg by mouth every evening.    Yes [provider]  ibuprofen (ADVIL,MOTRIN) 600 MG tablet Take 1 tablet (600 mg total) by mouth every 6 (six) hours. 10/06/14  Yes Julio Sicks, NP  predniSONE (DELTASONE) 5 MG tablet Take 5 mg by mouth daily with breakfast.   Yes [provider]  amoxicillin (AMOXIL) 875 MG tablet Take 1  tablet (875 mg total) by mouth 2 (two) times daily. 05/14/15   Salley Scarlet, MD  amoxicillin-clavulanate (AUGMENTIN) 875-125 MG tablet Take 1 tablet by mouth 2 (two) times daily. 07/22/20   Donita Brooks, MD  azaTHIOprine (IMURAN) 50 MG tablet Take 50-100 mg by mouth daily. 100 mg AM, 50 mg PM 12/24/13   [provider]  azithromycin (ZITHROMAX) 250 MG tablet 2 tabs poqday1, 1 tab poqday 2-5 07/28/19   Donita Brooks, MD  DULoxetine (CYMBALTA) 20 MG capsule Take 20 mg by mouth daily. 09/06/20   [provider]  DULoxetine (CYMBALTA) 60 MG capsule Take 60 mg by mouth daily. 08/25/20   [provider]  norethindrone-ethinyl estradiol-FE (JUNEL FE 1/20) 1-20 MG-MCG tablet Take 1 tablet by mouth daily.    [provider]  Prenatal Vit-Fe Fumarate-FA (PRENATAL MULTIVITAMIN) TABS tablet Take 1 tablet by mouth daily at 12 noon. Reported on 05/14/2015    [provider]  sulfamethoxazole-trimethoprim (BACTRIM DS) 800-160 MG tablet Take 1 tablet by mouth 2 (two) times daily. 02/26/20   Donita Brooks, MD  triamcinolone (NASACORT AQ) 55 MCG/ACT AERO nasal inhaler Place 2 sprays into the nose daily. 05/14/15   Salley Scarlet, MD  venlafaxine XR (EFFEXOR-XR) 75 MG 24 hr capsule Take 75 mg by mouth daily. 04/21/15   [provider]    Family History Family History  Problem Relation Age of Onset   Asthma Mother    Hypertension Mother    Parkinson's disease Father    Hyperlipidemia Father     Social History Social History   Tobacco Use   Smoking status: Never   Smokeless tobacco: Never  Vaping Use   Vaping Use: Never used  Substance Use Topics   Alcohol use: No   Drug use: No     Allergies   Erythromycin   Review of Systems Review of Systems  Constitutional:  Negative for activity change, appetite change, diaphoresis and fever.  Eyes:  Positive for photophobia. Negative for visual disturbance.  Respiratory:  Negative for cough,  shortness of breath and wheezing.   Cardiovascular:  Positive for chest pain and leg swelling. Negative for palpitations.  Skin:  Positive for color change.  Neurological:  Positive for headaches. Negative for dizziness and syncope.  Hematological: Negative.   Psychiatric/Behavioral: Negative.      Physical Exam Triage Vital Signs ED Triage Vitals  Enc Vitals Group     BP 10/16/20 0917 (!) 159/97     Pulse Rate 10/16/20 0917  96     Resp 10/16/20 0917 18     Temp 10/16/20 0917 98 F (36.7 C)     Temp Source 10/16/20 0917 Oral     SpO2 10/16/20 0917 97 %     Weight 10/16/20 0919 185 lb (83.9 kg)     Height 10/16/20 0919 5\' 4"  (1.626 m)     Head Circumference --      Peak Flow --      Pain Score 10/16/20 0918 2     Pain Loc --      Pain Edu? --      Excl. in GC? --    No data found.  Updated Vital Signs BP (!) 159/97 (BP Location: Right Arm)   Pulse 96   Temp 98 F (36.7 C) (Oral)   Resp 18   Ht 5\' 4"  (1.626 m)   Wt 185 lb (83.9 kg)   LMP 09/23/2020 (Exact Date)   SpO2 97%   BMI 31.76 kg/m   Visual Acuity Right Eye Distance:   Left Eye Distance:   Bilateral Distance:    Right Eye Near:   Left Eye Near:    Bilateral Near:     Physical Exam Vitals and nursing note reviewed.  Constitutional:      General: She is not in acute distress.    Appearance: Normal appearance. She is not ill-appearing.  HENT:     Head: Normocephalic and atraumatic.  Cardiovascular:     Rate and Rhythm: Normal rate and regular rhythm.     Pulses: Normal pulses.     Heart sounds: Normal heart sounds. No murmur heard.   No gallop.  Pulmonary:     Effort: Pulmonary effort is normal.     Breath sounds: Normal breath sounds. No wheezing, rhonchi or rales.  Musculoskeletal:        General: No swelling or tenderness. Normal range of motion.     Cervical back: Neck supple.     Right lower leg: No edema.     Left lower leg: No edema.  Skin:    General: Skin is warm and dry.      Capillary Refill: Capillary refill takes less than 2 seconds.     Findings: No bruising or erythema.  Neurological:     General: No focal deficit present.     Mental Status: She is alert and oriented to person, place, and time.     Sensory: No sensory deficit.     Motor: No weakness.  Psychiatric:        Mood and Affect: Mood normal.        Behavior: Behavior normal.        Thought Content: Thought content normal.        Judgment: Judgment normal.     UC Treatments / Results  Labs (all labs ordered are listed, but only abnormal results are displayed) Labs Reviewed - No data to display  EKG EKG shows sinus rhythm with a ventricular rate of 80 bpm. PR interval 108 ms QRS duration 88 ms QT/QTc 388/447 ms. EKG is normal axis and there is no ST elevation or depression on the EKG.  The PR interval is of short duration   Radiology No results found.  Procedures Procedures (including critical care time)  Medications Ordered in UC Medications - No data to display  Initial Impression / Assessment and Plan / UC Course  I have reviewed the triage vital signs and the nursing notes.  Pertinent labs &  imaging results that were available during my care of the patient were reviewed by me and considered in my medical decision making (see chart for details).  Patient is a nontoxic-appearing 40 year old female here for evaluation of elevated blood pressure and central chest pressure.  The blood pressure has been elevated for least the last 3 days and the chest pressure began last night.  Patient also had a headache at the onset of her elevated BP that she describes as the worst of her life that was in the occipital region went down her neck into her upper shoulders and was rated 8/10.  That has resolved.  She is not having any visual changes.  Chest pressure is currently 3/10 and was a 5-6/10 at maximum.  She describes it as feeling as if her 40-year-old is sitting on her chest.   Patient's  physical exam reveals cranial nerves II through XII are grossly intact.  Cardiopulmonary exam reveals S1-S2 heart sounds without murmur, rub, or ectopy.  Lungs are clear to auscultation in all fields.  Patient has no calf swelling, tenderness, and has a negative Homans' sign.  DP and PT pulses in bilateral lower extremities are 2+.  Given patient's recent fall, chronic and burst steroid use, and immobility there is concern for blood clot formation.  The swelling has resolved and lower leg but with the chest pressure there is concern that patient has a PE and also coupled with a headache which she describes as the worst of her life I feel the patient should be evaluated in the emergency department for rule out PE and or acute intracranial process.  Patient does have a mildly elevated blood pressure here 159/97.  Will obtain EKG.  EKG does not show any acute abnormality and there is no ST elevation or depression noted.  With a normal EKG and stable vital signs I believe patient is stable to go to the emergency department via privately owned vehicle for evaluation of possible PE.     Final Clinical Impressions(s) / UC Diagnoses   Final diagnoses:  Chest pressure  Hypertension, unspecified type     Discharge Instructions      Your EKG did not show any abnormality but your chest pressure combined with your recent injury and chronic steroid use is concerning for possible blood clot formation.     ED Prescriptions   None    PDMP not reviewed this encounter.   Becky Augustayan, Bradee Common, NP 10/16/20 1101

## 2020-10-16 NOTE — ED Notes (Signed)
Patient is being discharged from the Urgent Care and sent to the [facility] Emergency Department via private vehicle . Per Becky Augusta, NP, patient is in need of higher level of care due to needing further evaluation. Patient is aware and verbalizes understanding of plan of care.  Vitals:   10/16/20 0917  BP: (!) 159/97  Pulse: 96  Resp: 18  Temp: 98 F (36.7 C)  SpO2: 97%

## 2020-10-16 NOTE — ED Triage Notes (Signed)
Pt has fell at work (has been treated), went for a re-check on Wed and noted her BP was elevated. Staff was concerning. No hx of HTN, no meds.  150/100 Wednesday 160/100 yesterday 149/104 at home this am 159/97 (now)  Pt also reports headaches radiates to neck and top of shoulders. Pt was treated cellulitis to right leg (leg injured when fell over a week PTA) w/ABX. Pt feels all events is related to her headaches. Denies vision changes or nausea w/headache.   Also reports "feel like my heart was pounding and pressure on my chest last night". 2/10 tightness at this time.

## 2020-10-16 NOTE — Discharge Instructions (Addendum)
Your EKG did not show any abnormality but your chest pressure combined with your recent injury and chronic steroid use is concerning for possible blood clot formation.  Please go to the emergency department at [facility] for blood work and imaging to evaluate for possible blood clot in your lungs.

## 2020-10-21 ENCOUNTER — Ambulatory Visit: Payer: BC Managed Care – PPO | Admitting: Family Medicine

## 2020-10-21 ENCOUNTER — Other Ambulatory Visit: Payer: Self-pay

## 2020-10-21 VITALS — BP 154/82 | HR 86 | Temp 98.1°F | Resp 16 | Ht 64.0 in | Wt 193.0 lb

## 2020-10-21 DIAGNOSIS — I10 Essential (primary) hypertension: Secondary | ICD-10-CM

## 2020-10-21 MED ORDER — HYDROCHLOROTHIAZIDE 25 MG PO TABS: 25.0000 mg | ORAL_TABLET | Freq: Every day | ORAL | 3 refills | Status: DC

## 2020-10-21 NOTE — Progress Notes (Signed)
Subjective:    Patient ID: Kendra Jacobson, female    DOB: 1980-05-22, 40 y.o.   MRN: [id number]  HPI  Patient is a very pleasant40 y.o. white female with lupus who presents for elevated blood pressure.  Her systolic blood pressure has been consistently running between 150 and 170.  She even went to an emergency room on Saturday due to chest discomfort.  They ruled out a DVT by checking a D-dimer.  Blood pressure there was in the 160 range systolic.  She is here today to follow-up.  She denies any chest pain today.  She does report dyspnea on exertion but she associates that with being deconditioned.  She denies any pleurisy or hemoptysis.  Her rheumatologist checks her blood work every 3 months.  She denies any recent issues with her kidney function.  She denies any hematuria.  She has an appointment to see her rheumatologist again in less than a month. Past Medical History  Diagnosis Date   Depression    Lupus    Complication of anesthesia     hallucinated with pain medicine after surgery   Past Surgical History  Procedure Laterality Date   Tonsillectomy  40   Cesarean section  04/2009   Wisdom tooth extraction  2000    x4   Muscle biopsy Left 09/18/2012    Procedure: MUSCLE BIOPSY;  Surgeon: Lodema Pilot, DO;  Location: [facility];  Service: General;  Laterality: Left;   Current Outpatient Prescriptions on File Prior to Visit  Medication Sig Dispense Refill   azaTHIOprine (IMURAN) 50 MG tablet   3   docusate sodium (COLACE) 100 MG capsule Take 100 mg by mouth daily.      hydroxychloroquine (PLAQUENIL) 200 MG tablet Take 400 mg by mouth every evening.      phenazopyridine (PYRIDIUM) 100 MG tablet Take 1 tablet (100 mg total) by mouth 3 (three) times daily as needed for pain. 10 tablet 0   predniSONE (DELTASONE) 5 MG tablet Take 5 mg by mouth daily with breakfast.     Prenatal Vit-Fe Fumarate-FA (PRENATAL MULTIVITAMIN) TABS tablet Take 1 tablet by mouth daily at 12 noon.      No current facility-administered medications on file prior to visit.   Allergies  Allergen Reactions   Erythromycin Other (See Comments)    Upset stomach    History   Social History   Marital Status: Married    Spouse Name: N/A    Number of Children: N/A   Years of Education: N/A   Occupational History   Not on file.   Social History Main Topics   Smoking status: Never Smoker    Smokeless tobacco: Never Used   Alcohol Use: No   Drug Use: No   Sexual Activity: Not on file   Other Topics Concern   Not on file   Social History Narrative     Review of Systems  All other systems reviewed and are negative.     Objective:   Physical Exam Constitutional:      General: She is not in acute distress.    Appearance: Normal appearance. She is obese. She is not ill-appearing or toxic-appearing.  Cardiovascular:     Rate and Rhythm: Normal rate and regular rhythm.     Heart sounds: Normal heart sounds. No murmur heard.   No gallop.  Pulmonary:     Effort: Pulmonary effort is normal. No respiratory distress.     Breath sounds: Normal breath sounds. No stridor.  No wheezing, rhonchi or rales.  Musculoskeletal:     Right lower leg: No edema.     Left lower leg: No edema.  Neurological:     Mental Status: She is alert.          Assessment & Plan:  Benign essential HTN Add HCTZ 25 mg daily recheck blood pressure in 1 month along with a BMP to monitor renal function and potassium.  We discussed losartan and amlodipine and she elected hydrochlorothiazide based on its diuretic properties

## 2020-10-29 ENCOUNTER — Encounter: Payer: Self-pay | Admitting: Family Medicine

## 2020-10-29 DIAGNOSIS — I10 Essential (primary) hypertension: Secondary | ICD-10-CM

## 2020-10-29 DIAGNOSIS — R000 Tachycardia, unspecified: Secondary | ICD-10-CM

## 2020-11-09 MED ORDER — METOPROLOL SUCCINATE ER 25 MG PO TB24: 25.0000 mg | ORAL_TABLET | Freq: Every day | ORAL | 3 refills | Status: DC

## 2020-11-29 ENCOUNTER — Encounter: Payer: Self-pay | Admitting: Family Medicine

## 2020-12-06 ENCOUNTER — Encounter: Payer: Self-pay | Admitting: Cardiology

## 2020-12-06 ENCOUNTER — Ambulatory Visit: Payer: BC Managed Care – PPO | Admitting: Cardiology

## 2020-12-06 ENCOUNTER — Other Ambulatory Visit: Payer: Self-pay

## 2020-12-06 VITALS — BP 130/92 | HR 77 | Ht 64.0 in | Wt 191.5 lb

## 2020-12-06 DIAGNOSIS — I10 Essential (primary) hypertension: Secondary | ICD-10-CM

## 2020-12-06 DIAGNOSIS — Z6832 Body mass index (BMI) 32.0-32.9, adult: Secondary | ICD-10-CM | POA: Diagnosis not present

## 2020-12-06 NOTE — Progress Notes (Signed)
Cardiology Office Note:    Date:  12/06/2020   ID:  Kendra Jacobson, DOB 13-Aug-1980, MRN [id number]  PCP:  Donita Brooks, MD   [facility] Providers Cardiologist:  None     Referring MD: Donita Brooks, MD   Chief Complaint  Patient presents with   OTHER    HTN/Tachycardia c/o rash after starting recently added medications. Meds reviewed verbally with pt.   Kendra Jacobson is a 40 y.o. female who is being seen today for the evaluation of hypertension at the request of Donita Brooks, MD.   History of Present Illness:    Kendra Jacobson is a 40 y.o. female with a hx of hypertension, lupus who presents due to elevated blood pressures.  Patient had right leg pain, presented at an urgent care where blood pressure was noted to be elevated.  She initially thought her elevated blood pressures were due to pain.  At PCP visit, BP was elevated, she was started on HCTZ 25 mg daily.  She also noticed her heart rates were elevated at about 110 resting.  Primary care provider started Toprol-XL for both heart rate and BP benefit.  2 weeks later, she develops a rash in her arms.  Denies any itching.  Denies starting any other medication.  She has been on other medications for years now.  Recently lost about 20 pounds over the past year, states gaining about 10 pounds.  Past Medical History:  Diagnosis Date   Complication of anesthesia    hallucinated with pain medicine after surgery   Depression    Hypertension    Lupus [facility])    Dr.Beekman [facility]    Past Surgical History:  Procedure Laterality Date   CESAREAN SECTION  04/2009   CESAREAN SECTION N/A 10/03/2014   Procedure: CESAREAN SECTION;  Surgeon: Marcelle Overlie, MD;  Location: [facility];  Service: Obstetrics;  Laterality: N/A;   MUSCLE BIOPSY Left 09/18/2012   Procedure: MUSCLE BIOPSY;  Surgeon: Lodema Pilot, DO;  Location: [facility];  Service: General;  Laterality: Left;   TONSILLECTOMY  1999   WISDOM  TOOTH EXTRACTION  2000   x4    Current Medications: Current Meds  Medication Sig   azaTHIOprine (IMURAN) 50 MG tablet Take 100 mg by mouth 2 times daily at 12 noon and 4 pm.   DULoxetine (CYMBALTA) 60 MG capsule Take 60 mg by mouth daily.   gabapentin (NEURONTIN) 100 MG capsule Take 100 mg by mouth at bedtime.   hydrochlorothiazide (HYDRODIURIL) 25 MG tablet Take 1 tablet (25 mg total) by mouth daily.   hydroxychloroquine (PLAQUENIL) 200 MG tablet Take 400 mg by mouth every evening.    norethindrone-ethinyl estradiol-FE (LOESTRIN FE) 1-20 MG-MCG tablet Take 1 tablet by mouth daily.   predniSONE (DELTASONE) 5 MG tablet Take 5 mg by mouth daily with breakfast.   [DISCONTINUED] metoprolol succinate (TOPROL-XL) 25 MG 24 hr tablet Take 1 tablet (25 mg total) by mouth daily.     Allergies:   Erythromycin   Social History   Socioeconomic History   Marital status: Married    Spouse name: Not on file   Number of children: Not on file   Years of education: Not on file   Highest education level: Not on file  Occupational History   Not on file  Tobacco Use   Smoking status: Never   Smokeless tobacco: Never  Vaping Use   Vaping Use: Never used  Substance and Sexual Activity   Alcohol  use: No   Drug use: No   Sexual activity: Yes  Other Topics Concern   Not on file  Social History Narrative   Not on file   Social Determinants of Health   Financial Resource Strain: Not on file  Food Insecurity: Not on file  Transportation Needs: Not on file  Physical Activity: Not on file  Stress: Not on file  Social Connections: Not on file     Family History: The patient's family history includes Asthma in her mother; Hyperlipidemia in her father; Hypertension in her maternal aunt, maternal uncle, and mother; Parkinson's disease in her father.  ROS:   Please see the history of present illness.     All other systems reviewed and are negative.  EKGs/Labs/Other Studies Reviewed:    The  following studies were reviewed today:   EKG:  EKG is  ordered today.  The ekg ordered today demonstrates normal sinus rhythm, normal ECG.  Recent Labs: No results found for requested labs within last 8760 hours.  Recent Lipid Panel    Component Value Date/Time   CHOL 200 02/12/2014 0947   TRIG 56 02/12/2014 0947   HDL 70 02/12/2014 0947   CHOLHDL 2.9 02/12/2014 0947   VLDL 11 02/12/2014 0947   LDLCALC 119 (H) 02/12/2014 0947     Risk Assessment/Calculations:          Physical Exam:    VS:  BP (!) 130/92 (BP Location: Right Arm, Patient Position: Sitting, Cuff Size: Normal)   Pulse 77   Ht 5\' 4"  (1.626 m)   Wt 191 lb 8 oz (86.9 kg)   SpO2 98%   BMI 32.87 kg/m     Wt Readings from Last 3 Encounters:  12/06/20 191 lb 8 oz (86.9 kg)  10/21/20 193 lb (87.5 kg)  10/16/20 185 lb (83.9 kg)     GEN:  Well nourished, well developed in no acute distress HEENT: Normal NECK: No JVD; No carotid bruits LYMPHATICS: No lymphadenopathy CARDIAC: RRR, no murmurs, rubs, gallops RESPIRATORY:  Clear to auscultation without rales, wheezing or rhonchi  ABDOMEN: Soft, non-tender, non-distended MUSCULOSKELETAL:  No edema; No deformity  SKIN: Warm and dry NEUROLOGIC:  Alert and oriented x 3 PSYCHIATRIC:  Normal affect   ASSESSMENT:    1. Primary hypertension   2. BMI 32.0-32.9,adult    PLAN:    In order of problems listed above:  Hypertension, stop Toprol-XL due to recent rash, continue HCTZ 25 mg daily.  Low-salt diet advised.  If BP stays elevated, and heart rate becomes elevated, will consider Coreg.  I believe cutting back on salt, eating healthier, weight loss will help tremendously with patient's blood pressure and heart rates. Obesity, low-calorie diet, weight loss advised.  Graduated exercising also advised.  Follow-up in 4 to 6 weeks.    Medication Adjustments/Labs and Tests Ordered: Current medicines are reviewed at length with the patient today.  Concerns regarding  medicines are outlined above.  Orders Placed This Encounter  Procedures   EKG 12-Lead    No orders of the defined types were placed in this encounter.   Patient Instructions  Medication Instructions:   Your physician has recommended you make the following change in your medication:    1. STOP Toprol XL   *If you need a refill on your cardiac medications before your next appointment, please call your pharmacy*   Lab Work:  None Ordered  If you have labs (blood work) drawn today and your tests are completely normal,  you will receive your results only by: MyChart Message (if you have MyChart) OR A paper copy in the mail If you have any lab test that is abnormal or we need to change your treatment, we will call you to review the results.   Testing/Procedures:  None Ordered  Follow-Up: At [facility], you and your health needs are our priority.  As part of our continuing mission to provide you with exceptional heart care, we have created designated Provider Care Teams.  These Care Teams include your primary Cardiologist (physician) and Advanced Practice Providers (APPs -  Physician Assistants and Nurse Practitioners) who all work together to provide you with the care you need, when you need it.  We recommend signing up for the patient portal called "MyChart".  Sign up information is provided on this After Visit Summary.  MyChart is used to connect with patients for Virtual Visits (Telemedicine).  Patients are able to view lab/test results, encounter notes, upcoming appointments, etc.  Non-urgent messages can be sent to your provider as well.   To learn more about what you can do with MyChart, go to [email or web address].    Your next appointment:   4-6  week(s)  The format for your next appointment:   In Person  Provider:   Debbe Odea, MD ONLY   Other Instructions     Signed, Debbe Odea, MD  12/06/2020 4:52 PM    [facility]  [facility]Care

## 2020-12-06 NOTE — Patient Instructions (Signed)
Medication Instructions:   Your physician has recommended you make the following change in your medication:    1. STOP Toprol XL   *If you need a refill on your cardiac medications before your next appointment, please call your pharmacy*   Lab Work:  None Ordered  If you have labs (blood work) drawn today and your tests are completely normal, you will receive your results only by: MyChart Message (if you have MyChart) OR A paper copy in the mail If you have any lab test that is abnormal or we need to change your treatment, we will call you to review the results.   Testing/Procedures:  None Ordered  Follow-Up: At [facility], you and your health needs are our priority.  As part of our continuing mission to provide you with exceptional heart care, we have created designated Provider Care Teams.  These Care Teams include your primary Cardiologist (physician) and Advanced Practice Providers (APPs -  Physician Assistants and Nurse Practitioners) who all work together to provide you with the care you need, when you need it.  We recommend signing up for the patient portal called "MyChart".  Sign up information is provided on this After Visit Summary.  MyChart is used to connect with patients for Virtual Visits (Telemedicine).  Patients are able to view lab/test results, encounter notes, upcoming appointments, etc.  Non-urgent messages can be sent to your provider as well.   To learn more about what you can do with MyChart, go to [email or web address].    Your next appointment:   4-6  week(s)  The format for your next appointment:   In Person  Provider:   Debbe Odea, MD ONLY   Other Instructions

## 2021-01-10 ENCOUNTER — Ambulatory Visit: Payer: BC Managed Care – PPO | Admitting: Cardiology

## 2021-01-19 ENCOUNTER — Encounter: Payer: Self-pay | Admitting: Family Medicine

## 2021-09-06 ENCOUNTER — Other Ambulatory Visit: Payer: Self-pay | Admitting: Obstetrics and Gynecology

## 2021-09-06 DIAGNOSIS — R109 Unspecified abdominal pain: Secondary | ICD-10-CM

## 2021-09-09 ENCOUNTER — Ambulatory Visit
Admission: RE | Admit: 2021-09-09 | Discharge: 2021-09-09 | Disposition: A | Discharge status: Home / Self Care | Payer: BC Managed Care – PPO | Source: Ambulatory Visit | Attending: Obstetrics and Gynecology | Admitting: Obstetrics and Gynecology

## 2021-09-09 DIAGNOSIS — R109 Unspecified abdominal pain: Secondary | ICD-10-CM

## 2021-10-08 ENCOUNTER — Other Ambulatory Visit: Payer: Self-pay | Admitting: Family Medicine

## 2021-10-11 NOTE — Telephone Encounter (Signed)
Pls call pt for an annual appt w/pcp. Thank you

## 2021-10-14 ENCOUNTER — Other Ambulatory Visit: Payer: Self-pay | Admitting: Family Medicine

## 2021-10-17 NOTE — Telephone Encounter (Signed)
Left voicemail message to return call; need to schedule annual cpe. LOV 10/21/20.

## 2021-11-02 ENCOUNTER — Other Ambulatory Visit: Payer: Self-pay | Admitting: Family Medicine

## 2021-11-02 NOTE — Telephone Encounter (Signed)
Appt made for 11/08/21

## 2021-11-02 NOTE — Telephone Encounter (Signed)
Called and made her an appt. With Dr. Dennard Schaumann for physical on 11/08/2021 at 3:45.   Already had a 30 day courtesy refill so forwarded to practice per policy for provider review for refills prior to appt.

## 2021-11-08 ENCOUNTER — Ambulatory Visit (INDEPENDENT_AMBULATORY_CARE_PROVIDER_SITE_OTHER): Payer: BC Managed Care – PPO | Admitting: Family Medicine

## 2021-11-08 VITALS — BP 126/72 | HR 78 | Ht 64.0 in | Wt 199.2 lb

## 2021-11-08 DIAGNOSIS — M329 Systemic lupus erythematosus, unspecified: Secondary | ICD-10-CM

## 2021-11-08 DIAGNOSIS — I10 Essential (primary) hypertension: Secondary | ICD-10-CM

## 2021-11-08 DIAGNOSIS — Z6834 Body mass index (BMI) 34.0-34.9, adult: Secondary | ICD-10-CM | POA: Diagnosis not present

## 2021-11-08 DIAGNOSIS — Z0000 Encounter for general adult medical examination without abnormal findings: Secondary | ICD-10-CM

## 2021-11-08 MED ORDER — WEGOVY 0.5 MG/0.5ML ~~LOC~~ SOAJ: 0.5000 mg | SUBCUTANEOUS | 3 refills | Status: DC

## 2021-11-08 NOTE — Progress Notes (Signed)
Subjective:    Patient ID: Kendra Jacobson, female    DOB: 06/24/1980, 41 y.o.   MRN: [id number]  HPI  Patient is a very pleasant 41 year old white female with lupus who presents for CPE.  I last saw the patient about 1 year ago and started her on hydrochlorothiazide for hypertension.  Shortly thereafter we started metoprolol due to tachycardia in addition to hypertension.  Afterward she developed a rash.  I recommended stopping hydrochlorothiazide as I felt that was most likely medicine to contribute to her rash.   Wt Readings from Last 3 Encounters:  11/08/21 199 lb 3.2 oz (90.4 kg)  12/06/20 191 lb 8 oz (86.9 kg)  10/21/20 193 lb (87.5 kg)   Since her last visit, her weight is up 6 pounds.  Patient is still taking metoprolol as well as hydrochlorothiazide.  Her blood pressure today is well controlled as is her heart rate however she is complaining of significant fatigue.  Her psychiatrist recently started her on Ventolin for chronic fatigue associated with lupus.  She has not experienced any tachycardia since starting Ritalin.  However she does have joint pains particularly in her ankles and her knees.  I am concerned about her weight given her hypertension, her tachycardia, and her osteoarthritis and joint pain.  I believe that weight loss will help achieve control of her blood pressure medication, likely help with her joint pain, lower cholesterol, and improve her quality of life.  Therefore we spent the majority of today's visit discussing the GLP-1 agonist Past Medical History:  Diagnosis Date   Complication of anesthesia    hallucinated with pain medicine after surgery   Depression    Hypertension    Lupus [facility])    Dr.Beekman Rheumatology   Past Surgical History:  Procedure Laterality Date   CESAREAN SECTION  04/2009   CESAREAN SECTION N/A 10/03/2014   Procedure: CESAREAN SECTION;  Surgeon: Marcelle Overlie, MD;  Location: [facility];  Service: Obstetrics;  Laterality: N/A;    MUSCLE BIOPSY Left 09/18/2012   Procedure: MUSCLE BIOPSY;  Surgeon: Lodema Pilot, DO;  Location: [facility];  Service: General;  Laterality: Left;   TONSILLECTOMY  1999   WISDOM TOOTH EXTRACTION  2000   x4   Current Outpatient Medications on File Prior to Visit  Medication Sig Dispense Refill   azaTHIOprine (IMURAN) 50 MG tablet Take 100 mg by mouth 2 times daily at 12 noon and 4 pm.     DULoxetine (CYMBALTA) 60 MG capsule Take 60 mg by mouth daily.     gabapentin (NEURONTIN) 100 MG capsule Take 100 mg by mouth at bedtime.     hydroxychloroquine (PLAQUENIL) 200 MG tablet Take 400 mg by mouth every evening.      norethindrone-ethinyl estradiol-FE (LOESTRIN FE) 1-20 MG-MCG tablet Take 1 tablet by mouth daily.     predniSONE (DELTASONE) 5 MG tablet Take 5 mg by mouth daily with breakfast.     No current facility-administered medications on file prior to visit.   Allergies  Allergen Reactions   Erythromycin Other (See Comments)    Upset stomach    Social History   Socioeconomic History   Marital status: Married    Spouse name: Not on file   Number of children: Not on file   Years of education: Not on file   Highest education level: Not on file  Occupational History   Not on file  Tobacco Use   Smoking status: Never   Smokeless tobacco: Never  Vaping  Use   Vaping Use: Never used  Substance and Sexual Activity   Alcohol use: No   Drug use: No   Sexual activity: Yes  Other Topics Concern   Not on file  Social History Narrative   Not on file   Social Determinants of Health   Financial Resource Strain: Not on file  Food Insecurity: Not on file  Transportation Needs: Not on file  Physical Activity: Not on file  Stress: Not on file  Social Connections: Not on file  Intimate Partner Violence: Not on file     Review of Systems  All other systems reviewed and are negative.      Objective:   Physical Exam Constitutional:      General: She is not in acute distress.     Appearance: Normal appearance. She is obese. She is not ill-appearing or toxic-appearing.  HENT:     Right Ear: Tympanic membrane and ear canal normal.     Left Ear: Tympanic membrane and ear canal normal.  Eyes:     Extraocular Movements: Extraocular movements intact.     Conjunctiva/sclera: Conjunctivae normal.     Pupils: Pupils are equal, round, and reactive to light.  Cardiovascular:     Rate and Rhythm: Normal rate and regular rhythm.     Heart sounds: Normal heart sounds. No murmur heard.    No gallop.  Pulmonary:     Effort: Pulmonary effort is normal. No respiratory distress.     Breath sounds: Normal breath sounds. No stridor. No wheezing, rhonchi or rales.  Abdominal:     General: Bowel sounds are normal. There is no distension.     Palpations: Abdomen is soft.     Tenderness: There is no abdominal tenderness. There is no guarding or rebound.  Musculoskeletal:        General: No swelling or deformity.     Cervical back: Normal range of motion and neck supple. No rigidity or tenderness.     Right lower leg: No edema.     Left lower leg: No edema.  Lymphadenopathy:     Cervical: No cervical adenopathy.  Skin:    Coloration: Skin is not jaundiced.     Findings: No bruising, erythema or lesion.  Neurological:     General: No focal deficit present.     Mental Status: She is alert and oriented to person, place, and time. Mental status is at baseline.     Cranial Nerves: No cranial nerve deficit.     Motor: No weakness.     Gait: Gait normal.     Deep Tendon Reflexes: Reflexes normal.  Psychiatric:        Mood and Affect: Mood normal.        Behavior: Behavior normal.        Thought Content: Thought content normal.        Judgment: Judgment normal.           Assessment & Plan:  Benign essential HTN - Plan: CBC with Differential/Platelet, Lipid panel, COMPLETE METABOLIC PANEL WITH GFR, Urinalysis, Routine w reflex microscopic  Lupus (HCC) - Plan: Urinalysis,  Routine w reflex microscopic  General medical exam  BMI 34.0-34.9,adult I believe that the patient's overall quality of life would improve with weight loss.  I believe that we would be able to discontinue her blood pressure medication which I believe could be contributing to her fatigue.  I believe it would help with her long-term disability and joint pain related to  her glucose.  Therefore I recommended trying Wegovy 0.5 mg subcu weekly and uptitrating on a monthly basis as tolerated.  I will also check a CBC a CMP and a lipid panel.  Check a urinalysis to evaluate for any proteinuria.  If there is significant proteinuria, I would recommend switching hydrochlorothiazide to an angiotensin receptor blocker.  Hopefully as the patient achieves weight loss, we can wean the patient off hydrochlorothiazide and gradually metoprolol.  Mammogram, Pap smear performed gynecology.  Patient received her flu shot today

## 2021-11-09 LAB — CBC WITH DIFFERENTIAL/PLATELET
Absolute Monocytes: 552 cells/uL (ref 200–950)
Basophils Absolute: 37 cells/uL (ref 0–200)
Basophils Relative: 0.6 %
Eosinophils Absolute: 37 cells/uL (ref 15–500)
Eosinophils Relative: 0.6 %
HCT: 42.7 % (ref 35.0–45.0)
Hemoglobin: 15.1 g/dL (ref 11.7–15.5)
Lymphs Abs: 1593 cells/uL (ref 850–3900)
MCH: 34 pg — ABNORMAL HIGH (ref 27.0–33.0)
MCHC: 35.4 g/dL (ref 32.0–36.0)
MCV: 96.2 fL (ref 80.0–100.0)
MPV: 9.4 fL (ref 7.5–12.5)
Monocytes Relative: 8.9 %
Neutro Abs: 3980 cells/uL (ref 1500–7800)
Neutrophils Relative %: 64.2 %
Platelets: 349 10*3/uL (ref 140–400)
RBC: 4.44 10*6/uL (ref 3.80–5.10)
RDW: 12.5 % (ref 11.0–15.0)
Total Lymphocyte: 25.7 %
WBC: 6.2 10*3/uL (ref 3.8–10.8)

## 2021-11-09 LAB — LIPID PANEL
Cholesterol: 215 mg/dL — ABNORMAL HIGH
HDL: 67 mg/dL
LDL Cholesterol (Calc): 128 mg/dL — ABNORMAL HIGH
Non-HDL Cholesterol (Calc): 148 mg/dL — ABNORMAL HIGH
Total CHOL/HDL Ratio: 3.2 (calc)
Triglycerides: 96 mg/dL

## 2021-11-09 LAB — URINALYSIS, ROUTINE W REFLEX MICROSCOPIC
Bacteria, UA: NONE SEEN /HPF
Bilirubin Urine: NEGATIVE
Glucose, UA: NEGATIVE
Hgb urine dipstick: NEGATIVE
Hyaline Cast: NONE SEEN /LPF
Ketones, ur: NEGATIVE
Nitrite: NEGATIVE
Protein, ur: NEGATIVE
RBC / HPF: NONE SEEN /HPF (ref 0–2)
Specific Gravity, Urine: 1.015 (ref 1.001–1.035)
WBC, UA: NONE SEEN /HPF (ref 0–5)
pH: 6.5 (ref 5.0–8.0)

## 2021-11-09 LAB — COMPLETE METABOLIC PANEL WITH GFR
AG Ratio: 1.8 (calc) (ref 1.0–2.5)
ALT: 41 U/L — ABNORMAL HIGH (ref 6–29)
AST: 44 U/L — ABNORMAL HIGH (ref 10–30)
Albumin: 4 g/dL (ref 3.6–5.1)
Alkaline phosphatase (APISO): 63 U/L (ref 31–125)
BUN: 13 mg/dL (ref 7–25)
CO2: 27 mmol/L (ref 20–32)
Calcium: 9 mg/dL (ref 8.6–10.2)
Chloride: 99 mmol/L (ref 98–110)
Creat: 0.71 mg/dL (ref 0.50–0.99)
Globulin: 2.2 g/dL (calc) (ref 1.9–3.7)
Glucose, Bld: 107 mg/dL — ABNORMAL HIGH (ref 65–99)
Potassium: 3.8 mmol/L (ref 3.5–5.3)
Sodium: 138 mmol/L (ref 135–146)
Total Bilirubin: 0.7 mg/dL (ref 0.2–1.2)
Total Protein: 6.2 g/dL (ref 6.1–8.1)
eGFR: 109 mL/min/{1.73_m2} (ref >=60)

## 2021-11-09 LAB — MICROSCOPIC MESSAGE

## 2021-11-28 ENCOUNTER — Encounter: Payer: Self-pay | Admitting: Family Medicine

## 2021-11-28 DIAGNOSIS — I10 Essential (primary) hypertension: Secondary | ICD-10-CM | POA: Insufficient documentation

## 2021-12-08 ENCOUNTER — Other Ambulatory Visit: Payer: Self-pay | Admitting: Family Medicine

## 2021-12-09 ENCOUNTER — Encounter: Payer: Self-pay | Admitting: Family Medicine

## 2021-12-09 ENCOUNTER — Other Ambulatory Visit: Payer: Self-pay

## 2021-12-09 DIAGNOSIS — I10 Essential (primary) hypertension: Secondary | ICD-10-CM

## 2021-12-09 MED ORDER — METOPROLOL TARTRATE 25 MG PO TABS: 25.0000 mg | ORAL_TABLET | Freq: Two times a day (BID) | ORAL | 3 refills | Status: DC

## 2021-12-14 ENCOUNTER — Telehealth: Payer: Self-pay

## 2021-12-14 ENCOUNTER — Other Ambulatory Visit: Payer: Self-pay

## 2021-12-14 MED ORDER — HYDROCHLOROTHIAZIDE 25 MG PO TABS: ORAL_TABLET | ORAL | 3 refills | Status: DC

## 2021-12-14 NOTE — Telephone Encounter (Signed)
WEGOVY APPROVED:  LETTER RECEIVED FROM [facility] THAT PT'S INSURANCE APPROVED WEGOVY FROM 11/28/2021-06/29/2022. PT ADVISED.

## 2021-12-14 NOTE — Telephone Encounter (Signed)
Requested medication (s) are due for refill today:   Not sure  Requested medication (s) are on the active medication list:   Yes as a historical med  Future visit scheduled:   Yes in a yr.   LOV 11/08/2021   Last ordered: Listed as historical.   No data.     Returned because wasn't sure if he wanted to restart this or not depending on her urine test result.      Requested Prescriptions  Pending Prescriptions Disp Refills   hydrochlorothiazide (HYDRODIURIL) 25 MG tablet      Sig: Take 1 tablet (25 mg total) by mouth daily.     Cardiovascular: Diuretics - Thiazide Failed - 12/14/2021  2:56 PM      Failed - Valid encounter within last 6 months    Recent Outpatient Visits           1 year ago Benign essential HTN   [facility] Tanya Nones, Priscille Heidelberg, MD   2 years ago Bronchitis with acute wheezing   [facility] Tanya Nones, Priscille Heidelberg, MD   6 years ago Acute maxillary sinusitis, recurrence not specified   [facility] [place], Velna Hatchet, MD   7 years ago Morbid obesity   [facility] Donita Brooks, MD   7 years ago Acute cystitis without hematuria   [facility] [place], Velna Hatchet, MD       Future Appointments             In 11 months Pickard, Priscille Heidelberg, MD [facility]            Passed - Cr in normal range and within 180 days    Creat  Date Value Ref Range Status  11/08/2021 0.71 0.50 - 0.99 mg/dL Final         Passed - K in normal range and within 180 days    Potassium  Date Value Ref Range Status  11/08/2021 3.8 3.5 - 5.3 mmol/L Final         Passed - Na in normal range and within 180 days    Sodium  Date Value Ref Range Status  11/08/2021 138 135 - 146 mmol/L Final         Passed - Last BP in normal range    BP Readings from Last 1 Encounters:  11/08/21 126/72

## 2021-12-14 NOTE — Telephone Encounter (Signed)
  Prescription Request  12/14/2021  Is this a "Controlled Substance" medicine? No  LOV: 12/14/2021   What is the name of the medication or equipment? hydrochlorothiazide (HYDRODIURIL) 25 MG tablet [[id number]]   Have you contacted your pharmacy to request a refill? Yes   Which pharmacy would you like this sent to? [id number] [place], [place] - [address]    Patient notified that their request is being sent to the clinical staff for review and that they should receive a response within 2 business days.   Please advise at [phone] (mobile)

## 2021-12-20 ENCOUNTER — Encounter: Payer: Self-pay | Admitting: Neurology

## 2022-02-09 NOTE — Progress Notes (Signed)
Initial neurology clinic note  SERVICE DATE: 02/16/22  Reason for Evaluation: Consultation requested by Donnetta Hail, MD for an opinion regarding foot pain and numbness. My final recommendations will be communicated back to the requesting physician by way of shared medical record or letter to requesting physician via [place] mail.  HPI: This is Ms. Kendra Jacobson, a 42 y.o. 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 The patient is alone today.  Patient's symptoms started around 8 years ago. She noticed that symptoms seemed to start in hands and feet at the same time.   Her main symptom is pain and numbness in feet. Patient is on her feet a lot. She will notice that the balls of her feet to her toes will go numb. It is not temperature dependent. She thinks the symptoms are worse during the day. The pain feels like a squeezing sensation, pressure sensation. It feels like her feet are asleep with pins and needles sensation. If she takes her shoes and socks off, this tends to help. She notices her feet with go blue and white at times.  She will also notice that her hands can go numb/tingle when driving.  Patient saw a neurologist 8-10 years ago who found nothing. She had an EMG that was reportedly normal (including looking for neuropathy and carpal tunnel). She had tried a wrist splint in the past that helped. She stopped after her symptoms improved on prednisone. She has noticed that when she gets prednisone her symptoms improve.  Patient takes gabapentin 100 mg at night. This does help some. Patient takes Cymbalta 40 mg daily. She used to take Cymbalta 60 mg daily but was having reduced sex drive, so this was decreased. She does not think this has helped her sex drive.  Her pain  also prevents her from exercising and losing weight.  Overall, patient's symptoms are stable (not improving or worsening).  Patient thinks that her PM is currently well controlled.  She does not report any constitutional symptoms like fever, night sweats, anorexia or unintentional weight loss.  EtOH use: none  Restrictive diet? No Family history of neuropathy/myopathy/neurologic disease? Father and grandfather who have parkinson's disease.   MEDICATIONS:  Outpatient Encounter Medications as of 02/16/2022  Medication Sig   azaTHIOprine (IMURAN) 50 MG tablet Take 100 mg by mouth 2 times daily at 12 noon and 4 pm.   DULoxetine (CYMBALTA) 60 MG capsule Take 60 mg by mouth daily.   gabapentin (NEURONTIN) 100 MG capsule Take 100 mg by mouth at bedtime.   hydrochlorothiazide (HYDRODIURIL) 25 MG tablet Take 1Take 25 mg by mouth daily.   hydroxychloroquine (PLAQUENIL) 200 MG tablet Take 400 mg by mouth every evening.    methylphenidate (RITALIN) 10 MG tablet Take 10 mg by mouth 2 (two) times daily.   metoprolol tartrate (LOPRESSOR) 25 MG tablet Take 1 tablet (25 mg total) by mouth 2 (two) times daily.   norethindrone-ethinyl estradiol-FE (LOESTRIN FE) 1-20 MG-MCG tablet Take 1 tablet by mouth daily.   predniSONE (DELTASONE) 5 MG tablet Take 5 mg by mouth daily with breakfast.   Semaglutide-Weight Management (WEGOVY) 0.5 MG/0.5ML SOAJ Inject 0.5 mg into the skin once a week.   No facility-administered encounter medications on file as of 02/16/2022.    PAST MEDICAL HISTORY: Past Medical History:  Diagnosis Date   Complication of anesthesia    hallucinated with pain medicine after surgery   Depression    Hypertension    Lupus [facility])    Dr.Beekman [facility]    PAST SURGICAL HISTORY: Past Surgical History:  Procedure Laterality Date   CESAREAN SECTION  04/2009   CESAREAN SECTION N/A 10/03/2014   Procedure: CESAREAN SECTION;  Surgeon: Dian Queen, MD;  Location: [facility];  Service:  Obstetrics;  Laterality: N/A;   MUSCLE BIOPSY Left 09/18/2012   Procedure: MUSCLE BIOPSY;  Surgeon: Madilyn Hook, DO;  Location: [facility];  Service: General;  Laterality: Left;   TONSILLECTOMY  1999   WISDOM TOOTH EXTRACTION  2000   x4    ALLERGIES: Allergies  Allergen Reactions   Erythromycin Other (See Comments)    Upset stomach     FAMILY HISTORY: Family History  Problem Relation Age of Onset   Asthma Mother    Hypertension Mother    Parkinson's disease Father    Hyperlipidemia Father    Hypertension Maternal Aunt    Hypertension Maternal Uncle     SOCIAL HISTORY: Social History   Tobacco Use   Smoking status: Never   Smokeless tobacco: Never  Vaping Use   Vaping Use: Never used  Substance Use Topics   Alcohol use: No   Drug use: No   Social History   Social History Narrative   Are you right handed or left handed? Right   Are you currently employed ? 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   Do you live at home alone?no   Who lives with you? Husband and 2 kids   What type of home do you live in: 1 story or 2 story? one    Caff.1 soda a day     OBJECTIVE: PHYSICAL EXAM: BP 133/85   Pulse 83   Ht 5\' 4"  (1.626 m)   Wt 201 lb 3.2 oz (91.3 kg)   SpO2 99%   BMI 34.54 kg/m   General: General appearance: Awake and alert. No distress. Cooperative with exam.  Skin: No obvious rash or jaundice. HEENT: Atraumatic. Anicteric. Cushingoid facies Lungs: Non-labored breathing on room air  Extremities: No edema. Bruising of left lateral foot (per patient from a fracture over a year ago) Psych: ?flat affect  Neurological: Mental Status: Alert. Speech fluent. No pseudobulbar affect Cranial Nerves: CNII: No RAPD. Visual fields grossly intact. CNIII, IV, VI: PERRL. No nystagmus. EOMI. CN V: Facial sensation intact bilaterally to fine touch. CN VII: Facial muscles symmetric and strong. No ptosis at rest. CN VIII: Hearing grossly intact  bilaterally. CN IX: No hypophonia. CN X: Palate elevates symmetrically. CN XI: Full strength shoulder shrug bilaterally. CN XII: Tongue protrusion full and midline. No atrophy or fasciculations. No significant dysarthria Motor: Tone is normal.  Individual muscle group testing (MRC grade out of 5):  Movement     Neck flexion 5    Neck extension 5     Right Left   Shoulder abduction 5 5   Shoulder adduction 5 5   Elbow flexion 5 5   Elbow extension 5 5   Finger extension 5 5   Finger flexion 5 5   Thumb abduction - APB 5 5    Hip flexion 5 5   Hip extension 5 5   Hip adduction 5 5   Hip abduction 5 5   Knee extension 5 5   Knee flexion 5 5   Dorsiflexion 5  5   Plantarflexion 5 5   Great toe extension 5 5   Great toe flexion 5 5     Reflexes:  Right Left   Bicep 2+ 2+   Tricep 2+ 2+   BrRad 2+ 2+   Knee 2+ 2+   Ankle 1+ 1+    Pathological Reflexes: Babinski: flexor response bilaterally Hoffman: absent bilaterally Troemner: absent bilaterally Sensation: Pinprick: Intact in all extremities Vibration: Intact except: Absent in left great toe, but all other toes intact Proprioception: Intact in bilateral great toes Coordination: Intact finger-to- nose-finger bilaterally. Romberg negative. Gait: Able to rise from chair with arms crossed unassisted. Normal, narrow-based gait. Able to walk on toes and heels.  Lab and Test Review: External labs: CMP: mildly elevated ALT (37) and AST (48), otherwise unremarkable CK (12/16/21): 256 (in 2014 was 1225) Muscle biopsy (~2014): mild polymyositis per rheumatology documentation  ASSESSMENT: MAY MANRIQUE is a 42 y.o. female who presents for evaluation of foot and hand pain and numbness. 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 Her neurological examination is essentially normal without significant abnormalities. The  etiology of patient's symptoms is currently unclear. There are no clear neuropathic findings on exam and previous EMG per patient was normal without concern for neuropathy. Symptoms are likely multifactorial with contributions from SLE and fibromyalgia. A small fiber neuropathy is possible as well, but patient currently has no known risk factors. I will send labs for treatable causes. We will also add a daytime dose of gabapentin since her symptoms appear worse during the day.  PLAN: -Blood work: HbA1c, B12, IFE, vit D -Continue Cymbalta 40 mg daily -Change gabapentin to 100 mg BID -Lidocaine cream PRN  -Return to clinic 6 months  The impression above as well as the plan as outlined below were extensively discussed with the patient who voiced understanding. All questions were answered to their satisfaction.  When available, results of the above investigations and possible further recommendations will be communicated to the patient via telephone/MyChart. Patient to call office if not contacted after expected testing turnaround time.   Total time spent reviewing records, interview, history/exam, documentation, and coordination of care on day of encounter:  50 min   Thank you for allowing me to participate in patient's care.  If I can answer any additional questions, I would be pleased to do so.  Kai Levins, MD   CC: Dennard Schaumann Cammie Mcgee, MD [id number] [place] [address]  CC: Referring provider: Hennie Duos, [place],  [place] [id number]

## 2022-02-16 ENCOUNTER — Ambulatory Visit: Payer: BC Managed Care – PPO | Admitting: Neurology

## 2022-02-16 ENCOUNTER — Encounter: Payer: Self-pay | Admitting: Neurology

## 2022-02-16 ENCOUNTER — Other Ambulatory Visit (INDEPENDENT_AMBULATORY_CARE_PROVIDER_SITE_OTHER): Payer: BC Managed Care – PPO

## 2022-02-16 VITALS — BP 133/85 | HR 83 | Ht 64.0 in | Wt 201.2 lb

## 2022-02-16 DIAGNOSIS — R209 Unspecified disturbances of skin sensation: Secondary | ICD-10-CM | POA: Diagnosis not present

## 2022-02-16 DIAGNOSIS — R200 Anesthesia of skin: Secondary | ICD-10-CM

## 2022-02-16 DIAGNOSIS — Z131 Encounter for screening for diabetes mellitus: Secondary | ICD-10-CM | POA: Diagnosis not present

## 2022-02-16 DIAGNOSIS — R202 Paresthesia of skin: Secondary | ICD-10-CM

## 2022-02-16 LAB — VITAMIN D 25 HYDROXY (VIT D DEFICIENCY, FRACTURES): VITD: 22.8 ng/mL — ABNORMAL LOW (ref 30.00–100.00)

## 2022-02-16 LAB — HEMOGLOBIN A1C: Hgb A1c MFr Bld: 5.2 % (ref 4.6–6.5)

## 2022-02-16 LAB — VITAMIN B12: Vitamin B-12: 233 pg/mL (ref 211–911)

## 2022-02-16 MED ORDER — GABAPENTIN 100 MG PO CAPS: 100.0000 mg | ORAL_CAPSULE | Freq: Two times a day (BID) | ORAL | 11 refills | Status: DC

## 2022-02-16 NOTE — Patient Instructions (Signed)
Your symptoms are likely a combination of your lupus and fibromyalgia. I am not sure there is nerve damage (neuropathy), but I will send some blood work today to look for treatable causes for your symptoms.  I would recommend you start taking gabapentin 100 mg during the day in addition to nighttime (twice daily).  Continue Cymbalta 40 mg daily.  You can also try Lidocaine cream as needed. Apply wear you have pain, tingling, or burning. Wear gloves to prevent your hands being numb. This can be bought over the counter at any drug store or online.   I will be in contact when I have your results. I would like to see you back in clinic in 6 months. Please let me know if you have any questions or concerns in the meantime.   The physicians and staff at [facility] are committed to providing excellent care. You may receive a survey requesting feedback about your experience at our office. We strive to receive "very good" responses to the survey questions. If you feel that your experience would prevent you from giving the office a "very good " response, please contact our office to try to remedy the situation. We may be reached at [phone]. Thank you for taking the time out of your busy day to complete the survey.  Kai Levins, MD [facility]

## 2022-02-23 LAB — IMMUNOFIXATION ELECTROPHORESIS
IgG (Immunoglobin G), Serum: 987 mg/dL (ref 600–1640)
IgM, Serum: 75 mg/dL (ref 50–300)
Immunoglobulin A: 111 mg/dL (ref 47–310)

## 2022-02-28 ENCOUNTER — Other Ambulatory Visit: Payer: Self-pay | Admitting: Family Medicine

## 2022-02-28 ENCOUNTER — Other Ambulatory Visit: Payer: Self-pay

## 2022-02-28 ENCOUNTER — Ambulatory Visit: Payer: BC Managed Care – PPO | Admitting: Family Medicine

## 2022-02-28 VITALS — BP 124/82 | HR 83 | Temp 98.7°F | Ht 64.0 in | Wt 199.0 lb

## 2022-02-28 DIAGNOSIS — I10 Essential (primary) hypertension: Secondary | ICD-10-CM

## 2022-02-28 DIAGNOSIS — R000 Tachycardia, unspecified: Secondary | ICD-10-CM

## 2022-02-28 DIAGNOSIS — H6692 Otitis media, unspecified, left ear: Secondary | ICD-10-CM

## 2022-02-28 MED ORDER — HYDROCHLOROTHIAZIDE 25 MG PO TABS: ORAL_TABLET | ORAL | 3 refills | Status: DC

## 2022-02-28 MED ORDER — WEGOVY 0.5 MG/0.5ML ~~LOC~~ SOAJ: 0.5000 mg | SUBCUTANEOUS | 3 refills | Status: DC

## 2022-02-28 MED ORDER — AMOXICILLIN 875 MG PO TABS: 875.0000 mg | ORAL_TABLET | Freq: Two times a day (BID) | ORAL | 0 refills | Status: AC

## 2022-02-28 NOTE — Progress Notes (Signed)
Subjective:    Patient ID: Kendra Jacobson, female    DOB: 09/04/1980, 42 y.o.   MRN: [id number]  HPI Patient began with symptoms of an upper respiratory infection on Friday.  Symptoms include head congestion and cough.  Over the weekend she developed pain and pressure in her sinuses.  She states that she feels like her head is in a tunnel.  She reports decreased hearing in both ears and pain and pressure in both ears.  Past Medical History:  Diagnosis Date   Complication of anesthesia    hallucinated with pain medicine after surgery   Depression    Hypertension    Lupus [facility])    Dr.Beekman Rheumat[facility]   Past Surgical History:  Procedure Laterality Date   CESAREAN SECTION  04/2009   CESAREAN SECTION N/A 10/03/2014   Procedure: CESAREAN SECTION;  Surgeon: Dian Queen, MD;  Location: [facility];  Service: Obstetrics;  Laterality: N/A;   MUSCLE BIOPSY Left 09/18/2012   Procedure: MUSCLE BIOPSY;  Surgeon: Madilyn Hook, DO;  Location: [facility];  Service: General;  Laterality: Left;   TONSILLECTOMY  1999   WISDOM TOOTH EXTRACTION  2000   x4   Current Outpatient Medications on File Prior to Visit  Medication Sig Dispense Refill   azaTHIOprine (IMURAN) 50 MG tablet Take 100 mg by mouth 2 times daily at 12 noon and 4 pm.     DULoxetine (CYMBALTA) 60 MG capsule Take 40 mg by mouth daily.     gabapentin (NEURONTIN) 100 MG capsule Take 1 capsule (100 mg total) by mouth 2 (two) times daily. 60 capsule 11   hydrochlorothiazide (HYDRODIURIL) 25 MG tablet Take 1Take 25 mg by mouth daily. 30 tablet 3   hydroxychloroquine (PLAQUENIL) 200 MG tablet Take 400 mg by mouth every evening.      methylphenidate (RITALIN) 10 MG tablet Take 10 mg by mouth 2 (two) times daily.     metoprolol tartrate (LOPRESSOR) 25 MG tablet Take 1 tablet (25 mg total) by mouth 2 (two) times daily. 90 tablet 3   norethindrone-ethinyl estradiol-FE (LOESTRIN FE) 1-20 MG-MCG tablet Take 1 tablet by mouth daily.      predniSONE (DELTASONE) 5 MG tablet Take 5 mg by mouth daily with breakfast.     Semaglutide-Weight Management (WEGOVY) 0.5 MG/0.5ML SOAJ Inject 0.5 mg into the skin once a week. 2 mL 3   No current facility-administered medications on file prior to visit.   Allergies  Allergen Reactions   Erythromycin Other (See Comments)    Upset stomach    Social History   Socioeconomic History   Marital status: Married    Spouse name: Not on file   Number of children: Not on file   Years of education: Not on file   Highest education level: Not on file  Occupational History   Not on file  Tobacco Use   Smoking status: Never   Smokeless tobacco: Never  Vaping Use   Vaping Use: Never used  Substance and Sexual Activity   Alcohol use: No   Drug use: No   Sexual activity: Yes  Other Topics Concern   Not on file  Social History Narrative   Are you right handed or left handed? Right   Are you currently employed ? 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   Do you live at home alone?no   Who lives with you? Husband and 2 kids   What type of home do you live in: 1  story or 2 story? one    Caff.1 soda a day   Social Determinants of Health   Financial Resource Strain: Not on file  Food Insecurity: Not on file  Transportation Needs: Not on file  Physical Activity: Not on file  Stress: Not on file  Social Connections: Not on file  Intimate Partner Violence: Not on file     Review of Systems  All other systems reviewed and are negative.      Objective:   Physical Exam Constitutional:      General: She is not in acute distress.    Appearance: Normal appearance. She is obese. She is not ill-appearing or toxic-appearing.  HENT:     Right Ear: Tympanic membrane and ear canal normal.     Left Ear: Tympanic membrane is erythematous and bulging.  Cardiovascular:     Rate and Rhythm: Normal rate and regular rhythm.     Heart sounds: Normal heart sounds. No murmur heard.     No gallop.  Pulmonary:     Effort: Pulmonary effort is normal. No respiratory distress.     Breath sounds: Normal breath sounds. No stridor. No wheezing, rhonchi or rales.  Musculoskeletal:     Right lower leg: No edema.     Left lower leg: No edema.  Neurological:     Mental Status: She is alert.           Assessment & Plan:  Left otitis media, unspecified otitis media type Patient has a left-sided ear infection.  Begin amoxicillin 875 mg twice daily for 10 days.

## 2022-03-07 ENCOUNTER — Encounter: Payer: Self-pay | Admitting: Family Medicine

## 2022-03-08 ENCOUNTER — Other Ambulatory Visit: Payer: Self-pay

## 2022-03-08 DIAGNOSIS — H6692 Otitis media, unspecified, left ear: Secondary | ICD-10-CM

## 2022-03-08 MED ORDER — CEFDINIR 300 MG PO CAPS: 300.0000 mg | ORAL_CAPSULE | Freq: Two times a day (BID) | ORAL | 0 refills | Status: AC

## 2022-04-19 ENCOUNTER — Encounter: Payer: Self-pay | Admitting: Neurology

## 2022-05-18 ENCOUNTER — Encounter: Payer: Self-pay | Admitting: Family Medicine

## 2022-05-19 ENCOUNTER — Other Ambulatory Visit: Payer: Self-pay

## 2022-05-19 DIAGNOSIS — Z0000 Encounter for general adult medical examination without abnormal findings: Secondary | ICD-10-CM

## 2022-05-19 DIAGNOSIS — M329 Systemic lupus erythematosus, unspecified: Secondary | ICD-10-CM

## 2022-05-19 DIAGNOSIS — I10 Essential (primary) hypertension: Secondary | ICD-10-CM

## 2022-05-19 DIAGNOSIS — E669 Obesity, unspecified: Secondary | ICD-10-CM | POA: Insufficient documentation

## 2022-05-19 DIAGNOSIS — E66811 Obesity, class 1: Secondary | ICD-10-CM | POA: Insufficient documentation

## 2022-05-24 ENCOUNTER — Other Ambulatory Visit: Payer: Self-pay | Admitting: Family Medicine

## 2022-05-24 ENCOUNTER — Encounter: Payer: Self-pay | Admitting: Family Medicine

## 2022-05-24 DIAGNOSIS — I10 Essential (primary) hypertension: Secondary | ICD-10-CM

## 2022-06-26 ENCOUNTER — Encounter: Payer: Self-pay | Admitting: Family Medicine

## 2022-06-29 ENCOUNTER — Other Ambulatory Visit: Payer: Self-pay | Admitting: Family Medicine

## 2022-06-29 MED ORDER — VORTIOXETINE HBR 10 MG PO TABS: 10.0000 mg | ORAL_TABLET | Freq: Every day | ORAL | 3 refills | Status: DC

## 2022-07-14 ENCOUNTER — Telehealth: Payer: Self-pay

## 2022-07-14 NOTE — Telephone Encounter (Signed)
My Chart message from pt: Since I started the Trintellix last week I've been nauseous in the morning and today I'm still not well at lunch time.  Can pt have something to help with the nausea? Pt aware you are out of the office until Monday. Thank you.

## 2022-07-17 ENCOUNTER — Other Ambulatory Visit: Payer: Self-pay | Admitting: Family Medicine

## 2022-07-17 MED ORDER — ONDANSETRON HCL 4 MG PO TABS: 4.0000 mg | ORAL_TABLET | Freq: Three times a day (TID) | ORAL | 0 refills | Status: DC | PRN

## 2022-07-27 ENCOUNTER — Ambulatory Visit: Payer: BC Managed Care – PPO | Admitting: Family Medicine

## 2022-07-27 VITALS — BP 120/74 | HR 78 | Temp 98.0°F | Ht 64.0 in | Wt 205.2 lb

## 2022-07-27 DIAGNOSIS — J300 Vasomotor rhinitis: Secondary | ICD-10-CM | POA: Insufficient documentation

## 2022-07-27 DIAGNOSIS — R7989 Other specified abnormal findings of blood chemistry: Secondary | ICD-10-CM

## 2022-07-27 DIAGNOSIS — J309 Allergic rhinitis, unspecified: Secondary | ICD-10-CM | POA: Insufficient documentation

## 2022-07-27 LAB — COMPLETE METABOLIC PANEL WITH GFR
Albumin: 3.5 g/dL — ABNORMAL LOW (ref 3.6–5.1)
Chloride: 107 mmol/L (ref 98–110)

## 2022-07-27 NOTE — Progress Notes (Signed)
Subjective:    Patient ID: Kendra Jacobson, female    DOB: 1980-06-13, 42 y.o.   MRN: [id number]   Patient is a 42 year old Caucasian female who presents today complaining of itching all over her body.  She states that this started around Christmas time.  She been dealing with itching on a daily basis since then.  It is primarily on her back and on her chest on her torso.  It occurs on a daily basis without any visible rash.  She contacted her dermatologist and tried several things including moisturizers, changing her detergent, changing her soaps but nothing helped.  Simultaneously, she was referred to gastroenterology due to elevated liver function test.  Mother has history of autoimmune hepatitis.  Liver functions to been elevated and the most significant elevation with alkaline phosphatase which is greater than 200 where his AST and ALT were in the 40s.  Anti-smooth muscle antibodies were negative.  Hepatitis B and C were negative.  Ceruloplasmin ferritin levels were normal.  Antimitochondrial antibodies were reportedly negative.  Plan was to monitor again in 3 months.  Patient had a right upper quadrant ultrasound that was negative in August 2023 Past Medical History:  Diagnosis Date   Complication of anesthesia    hallucinated with pain medicine after surgery   Depression    Hypertension    Lupus [facility])    Dr.Beekman Rhe[facility]   Past Surgical History:  Procedure Laterality Date   CESAREAN SECTION  04/2009   CESAREAN SECTION N/A 10/03/2014   Procedure: CESAREAN SECTION;  Surgeon: Marcelle Overlie, MD;  Location: [facility];  Service: Obstetrics;  Laterality: N/A;   MUSCLE BIOPSY Left 09/18/2012   Procedure: MUSCLE BIOPSY;  Surgeon: Lodema Pilot, DO;  Location: [facility];  Service: General;  Laterality: Left;   TONSILLECTOMY  1999   WISDOM TOOTH EXTRACTION  2000   x4   Current Outpatient Medications on File Prior to Visit  Medication Sig Dispense Refill   azaTHIOprine (IMURAN) 50 MG  tablet Take 100 mg by mouth 2 times daily at 12 noon and 4 pm.     hydrochlorothiazide (HYDRODIURIL) 25 MG tablet TAKE 1 TABLET BY MOUTH EVERY DAY 90 tablet 1   hydroxychloroquine (PLAQUENIL) 200 MG tablet Take 400 mg by mouth every evening.      metoprolol tartrate (LOPRESSOR) 25 MG tablet TAKE 1 TABLET BY MOUTH TWICE A DAY 180 tablet 1   norethindrone-ethinyl estradiol-FE (LOESTRIN FE) 1-20 MG-MCG tablet Take 1 tablet by mouth daily.     ondansetron (ZOFRAN) 4 MG tablet Take 1 tablet (4 mg total) by mouth every 8 (eight) hours as needed for nausea or vomiting. 20 tablet 0   pantoprazole (PROTONIX) 40 MG tablet Take 40 mg by mouth daily.     predniSONE (DELTASONE) 5 MG tablet Take 5 mg by mouth daily with breakfast.     vortioxetine HBr (TRINTELLIX) 10 MG TABS tablet Take 1 tablet (10 mg total) by mouth daily. 30 tablet 3   No current facility-administered medications on file prior to visit.   Allergies  Allergen Reactions   Erythromycin Other (See Comments)    Upset stomach    Social History   Socioeconomic History   Marital status: Married    Spouse name: Not on file   Number of children: Not on file   Years of education: Not on file   Highest education level: Bachelor's degree (e.g., BA, AB, BS)  Occupational History   Not on file  Tobacco Use  Smoking status: Never   Smokeless tobacco: Never  Vaping Use   Vaping Use: Never used  Substance and Sexual Activity   Alcohol use: No   Drug use: No   Sexual activity: Yes  Other Topics Concern   Not on file  Social History Narrative   Are you right handed or left handed? Right   Are you currently employed ? 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   Do you live at home alone?no   Who lives with you? Husband and 2 kids   What type of home do you live in: 1 story or 2 story? one    Caff.1 soda a day   Social Determinants of Health   Financial Resource Strain: Low Risk  (07/27/2022)   Overall Financial  Resource Strain (CARDIA)    Difficulty of Paying Living Expenses: Not hard at all  Food Insecurity: No Food Insecurity (07/27/2022)   Hunger Vital Sign    Worried About Running Out of Food in the Last Year: Never true    Ran Out of Food in the Last Year: Never true  Transportation Needs: No Transportation Needs (07/27/2022)   PRAPARE - Administrator, Civil Service (Medical): No    Lack of Transportation (Non-Medical): No  Physical Activity: Unknown (07/27/2022)   Exercise Vital Sign    Days of Exercise per Week: 0 days    Minutes of Exercise per Session: Not on file  Stress: No Stress Concern Present (07/27/2022)   [employer] of Occupational Health - Occupational Stress Questionnaire    Feeling of Stress : Only a little  Social Connections: Socially Integrated (07/27/2022)   Social Connection and Isolation Panel [NHANES]    Frequency of Communication with Friends and Family: More than three times a week    Frequency of Social Gatherings with Friends and Family: Once a week    Attends Religious Services: More than 4 times per year    Active Member of [employer] or Organizations: Yes    Attends Engineer, structural: More than 4 times per year    Marital Status: Married  Catering manager Violence: Not on file     Review of Systems  Gastrointestinal:  Positive for abdominal pain.  All other systems reviewed and are negative.      Objective:   Physical Exam Constitutional:      General: She is not in acute distress.    Appearance: Normal appearance. She is obese. She is not ill-appearing or toxic-appearing.  HENT:     Right Ear: Tympanic membrane and ear canal normal.     Left Ear: Tympanic membrane and ear canal normal.  Eyes:     Extraocular Movements: Extraocular movements intact.     Conjunctiva/sclera: Conjunctivae normal.     Pupils: Pupils are equal, round, and reactive to light.  Cardiovascular:     Rate and Rhythm: Normal rate and regular rhythm.      Heart sounds: Normal heart sounds. No murmur heard.    No gallop.  Pulmonary:     Effort: Pulmonary effort is normal. No respiratory distress.     Breath sounds: Normal breath sounds. No stridor. No wheezing, rhonchi or rales.  Abdominal:     General: Bowel sounds are normal. There is no distension.     Palpations: Abdomen is soft.     Tenderness: There is no abdominal tenderness. There is no guarding or rebound.  Musculoskeletal:  General: No swelling or deformity.     Cervical back: Normal range of motion and neck supple. No rigidity or tenderness.     Right lower leg: No edema.     Left lower leg: No edema.  Lymphadenopathy:     Cervical: No cervical adenopathy.  Skin:    Coloration: Skin is not jaundiced.     Findings: No bruising, erythema or lesion.  Neurological:     General: No focal deficit present.     Mental Status: She is alert and oriented to person, place, and time. Mental status is at baseline.     Cranial Nerves: No cranial nerve deficit.     Motor: No weakness.     Gait: Gait normal.     Deep Tendon Reflexes: Reflexes normal.  Psychiatric:        Mood and Affect: Mood normal.        Behavior: Behavior normal.        Thought Content: Thought content normal.        Judgment: Judgment normal.           Assessment & Plan:  Elevated liver function tests - Plan: Mitochondrial Antibodies, ANA, COMPLETE METABOLIC PANEL WITH GFR My concern is the patient's itching could either be neuropathic similar to notalgia paresthetica and tends responsive hydroxyzine gabapentin.  On my open [employer] is possible primary biliary cirrhosis given her elevated alkaline phosphatase, her history of autoimmune diseases, family history of autoimmune hepatitis.  For that reason I will repeat CMP today along with mitochondrial antibodies and an ANA.  If mitochondrial antibodies are elevated and the liver function test are worsening, I would recommend a biopsy to evaluate for  possible primary biliary cirrhosis.  If labs are stable, she can follow-up with her gastroenterologist in 3 months as planned to repeat lab work we may try empirically starting her on cholestyramine for itching to see if that helps.  If not we may try gabapentin at that point

## 2022-07-28 ENCOUNTER — Other Ambulatory Visit: Payer: Self-pay

## 2022-07-28 MED ORDER — CHOLESTYRAMINE LIGHT 4 G PO PACK: PACK | ORAL | 0 refills | Status: DC

## 2022-07-30 LAB — COMPLETE METABOLIC PANEL WITH GFR
AST: 50 U/L — ABNORMAL HIGH (ref 10–30)
Creat: 0.61 mg/dL (ref 0.50–0.99)
Total Protein: 6 g/dL — ABNORMAL LOW (ref 6.1–8.1)

## 2022-07-30 LAB — ANTI-NUCLEAR AB-TITER (ANA TITER): ANA Titer 1: 1:40 {titer} — ABNORMAL HIGH

## 2022-07-31 ENCOUNTER — Encounter: Payer: Self-pay | Admitting: Family Medicine

## 2022-08-01 ENCOUNTER — Other Ambulatory Visit: Payer: Self-pay

## 2022-08-01 DIAGNOSIS — R7989 Other specified abnormal findings of blood chemistry: Secondary | ICD-10-CM

## 2022-08-01 DIAGNOSIS — L299 Pruritus, unspecified: Secondary | ICD-10-CM

## 2022-08-01 DIAGNOSIS — R17 Unspecified jaundice: Secondary | ICD-10-CM

## 2022-08-01 MED ORDER — CHOLESTYRAMINE LIGHT 4 G PO PACK
PACK | ORAL | 1 refills | Status: DC
Start: 2022-08-01 — End: 2022-10-23

## 2022-08-02 ENCOUNTER — Encounter: Payer: Self-pay | Admitting: Family Medicine

## 2022-08-02 ENCOUNTER — Telehealth: Payer: Self-pay

## 2022-08-02 LAB — COMPLETE METABOLIC PANEL WITH GFR
AG Ratio: 1.4 (calc) (ref 1.0–2.5)
ALT: 50 U/L — ABNORMAL HIGH (ref 6–29)
Alkaline phosphatase (APISO): 170 U/L — ABNORMAL HIGH (ref 31–125)
BUN: 11 mg/dL (ref 7–25)
CO2: 24 mmol/L (ref 20–32)
Calcium: 8.8 mg/dL (ref 8.6–10.2)
Globulin: 2.5 g/dL (calc) (ref 1.9–3.7)
Glucose, Bld: 88 mg/dL (ref 65–99)
Potassium: 4 mmol/L (ref 3.5–5.3)
Sodium: 140 mmol/L (ref 135–146)
Total Bilirubin: 1.3 mg/dL — ABNORMAL HIGH (ref 0.2–1.2)
eGFR: 114 mL/min/{1.73_m2} (ref >=60)

## 2022-08-02 LAB — MITOCHONDRIAL ANTIBODIES: Mitochondrial M2 Ab, IgG: <=20 U (ref <=20.0)

## 2022-08-02 LAB — ANA: Anti Nuclear Antibody (ANA): POSITIVE — AB

## 2022-08-02 LAB — ANTI-NUCLEAR AB-TITER (ANA TITER): ANA TITER: 1:40 {titer} — ABNORMAL HIGH

## 2022-08-02 NOTE — Telephone Encounter (Signed)
My Chart message from patient:  I have all the symptoms of a uti on top of everything else. The urgency and pain hit this morning. I now Dr. Demetrius Charity is on vacation but I'm hoping something can be prescribed before this gets out of hand? Thanks!

## 2022-08-03 ENCOUNTER — Other Ambulatory Visit: Payer: Self-pay | Admitting: Family Medicine

## 2022-08-03 MED ORDER — CEPHALEXIN 500 MG PO CAPS
500.0000 mg | ORAL_CAPSULE | Freq: Three times a day (TID) | ORAL | 0 refills | Status: DC
Start: 2022-08-03 — End: 2022-09-12

## 2022-08-11 ENCOUNTER — Encounter: Payer: Self-pay | Admitting: Family Medicine

## 2022-08-13 ENCOUNTER — Encounter: Payer: Self-pay | Admitting: Family Medicine

## 2022-08-14 ENCOUNTER — Encounter: Payer: BC Managed Care – PPO | Attending: Family Medicine | Admitting: Dietician

## 2022-08-14 ENCOUNTER — Encounter: Payer: Self-pay | Admitting: Dietician

## 2022-08-14 ENCOUNTER — Encounter: Payer: Self-pay | Admitting: Family Medicine

## 2022-08-14 VITALS — Ht 64.0 in | Wt 201.4 lb

## 2022-08-14 DIAGNOSIS — I10 Essential (primary) hypertension: Secondary | ICD-10-CM | POA: Diagnosis not present

## 2022-08-14 DIAGNOSIS — E669 Obesity, unspecified: Secondary | ICD-10-CM

## 2022-08-14 DIAGNOSIS — M329 Systemic lupus erythematosus, unspecified: Secondary | ICD-10-CM

## 2022-08-14 NOTE — Progress Notes (Signed)
Medical Nutrition Therapy: Visit start time: 0940  end time: 1050  Assessment:   Referral Diagnosis: obesity, HTN, lupus Other medical history/ diagnoses: hyperlipidemia, elevated LFTs Psychosocial issues/ stress concerns: none  Medications, supplements: reconciled list in medical record   Preferred learning method:  Auditory Visual   Current weight: 201.4lbs Height: 5'4" BMI: 34.57 Patient's personal weight goal: 150lbs  Progress and evaluation:  Patient reports BP has increased over past 2 years, to the point of needing medication, she hopes weight loss will help.  Lupus is currently under control per patient.  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 She did not lose weight on that plan. She reports some GI upset/ nausea often in am, and she wonders if it could be hypoglycemia. She will sometimes vomit but then feels better. Recent lab results: HbA1C 5.2% 02/16/22; Total cholesterol 215, HDL 67, LDL 128, triglycerides 96 86/5/78 Food allergies: none Special diet practices: none Patient seeks help with reducing health risk, weight loss Next PCP appt is 09/2022   Dietary Intake:  Usual eating pattern includes 3 meals and 0-1 snacks per day. Dining out frequency: 2 meals per week. Who plans meals/ buys groceries? self Who prepares meals? self  Breakfast: chobani yogurt (at school); mini wheats cereal; today 2 eggs and 1 pc toast Snack: none Lunch: school lunch; lean cuisine/ healthy choice meal + fruit; leftovers Snack: none or fruit; occ chips and salsa Supper: 6-7pm more home-cooked meals recently ie pizza once a week; whole wheat or white pasta 1+ x a week; baked/ air-fried foods  Snack: none Beverages: water, some with sugar free flavoring; tomato juice; occ Dr Reino Kent zero (none recently due  to UTI); occ milk  Physical activity: no regular activity, mostly sedentary during summer   Intervention:   Nutrition Care Education:   Basic nutrition: basic food groups; appropriate nutrient balance; appropriate meal and snack schedule; general nutrition guidelines    Weight control: importance of low sugar and low fat choices; portion control; estimated energy needs for weight loss at 1300 kcal, provided guidance for 45% CHO, 25% pro, 30% fat Hypertension:  identifying high sodium foods, identifying food sources of potassium, magnesium Lupus: anti-inflammatory eating pattern ie Mediterranean, high veg and fruit intake, healthy fats, limiting saturated fats, processed foods, sugars  Other intervention notes: Patient is generally making healthy food choices and is motivated to continue. Established goals for additional change with direction from patient.   Nutritional Diagnosis:  [place]-2.1 Inpaired nutrition utilization and [place]-2.2 Altered nutrition-related laboratory As related to hypertension, hyperlipidemia.  As evidenced by patient with BP controlled with medication, elevated total cholesterol and LDL. [place]-3.3 Overweight/obesity As related to inadequate physical activity, history of excess calories, possible medication side effects.  As evidenced by patient with current BMI of 34.57.   Education Materials given:  Designer, industrial/product with food lists, sample meal pattern Sample menus Fighting Inflammation handout Visit summary with goals/ instructions   Learner/ who was taught:  Patient   Level of understanding: Verbalizes/ demonstrates competency  Demonstrated degree of understanding via:   Teach back Learning barriers: None  Willingness to learn/ readiness for change: Eager, change in progress   Monitoring and Evaluation:  Dietary intake, exercise, BP control, blood lipids, joint pain, and body weight      follow up:  10/18/22 at 4:00pm

## 2022-08-14 NOTE — Patient Instructions (Addendum)
Check out www.oldwayspt.org for anti-inflammatory meal and recipe ideas (mediterranean or african) Eat generous portions of vegetables and eat fruit several times a day. Limit portions of starches -- stretch portions by combining with veggies.  Find suitable ways to increase activity/ exercise gradually to control joint pain. Consider tracking food intake for at least 1 week or more.

## 2022-08-18 ENCOUNTER — Ambulatory Visit: Payer: BC Managed Care – PPO | Admitting: Neurology

## 2022-08-23 ENCOUNTER — Other Ambulatory Visit: Payer: Self-pay | Admitting: Family Medicine

## 2022-08-23 DIAGNOSIS — R17 Unspecified jaundice: Secondary | ICD-10-CM

## 2022-08-23 DIAGNOSIS — R7989 Other specified abnormal findings of blood chemistry: Secondary | ICD-10-CM

## 2022-08-23 DIAGNOSIS — L299 Pruritus, unspecified: Secondary | ICD-10-CM

## 2022-08-24 ENCOUNTER — Ambulatory Visit: Payer: BC Managed Care – PPO | Admitting: Neurology

## 2022-08-24 NOTE — Telephone Encounter (Signed)
Unable to refill per protocol, Rx request is too soon. Last refill 08/01/22 for 30 and 1 refill.  Requested Prescriptions  Pending Prescriptions Disp Refills   cholestyramine light (PREVALITE) 4 g packet [Pharmacy Med Name: CHOLESTYRAMINE LIGHT PACKET] 180 packet 1    Sig: ONE PACKET BY MOUTH TWICE A DAY FOR ITCHING.     Cardiovascular:  Antilipid - Bile Acid Sequestrants Failed - 08/23/2022  1:06 PM      Failed - Valid encounter within last 12 months    Recent Outpatient Visits           1 year ago Benign essential HTN   [facility] Tanya Nones, Priscille Heidelberg, MD   3 years ago Bronchitis with acute wheezing   [facility] Donita Brooks, MD   7 years ago Acute maxillary sinusitis, recurrence not specified   [facility] [place], Velna Hatchet, MD   8 years ago Morbid obesity   [facility] Donita Brooks, MD   8 years ago Acute cystitis without hematuria   [facility] [place], Velna Hatchet, MD       Future Appointments             In 2 months Pickard, Priscille Heidelberg, MD [facility]            Failed - Lipid Panel in normal range within the last 12 months    Cholesterol  Date Value Ref Range Status  11/08/2021 215 (H) <200 mg/dL Final   LDL Cholesterol (Calc)  Date Value Ref Range Status  11/08/2021 128 (H) mg/dL (calc) Final    Comment:    Reference range: <100 . Desirable range <100 mg/dL for primary prevention;   <70 mg/dL for patients with CHD or diabetic patients  with > or = 2 CHD risk factors. Marland Kitchen LDL-C is now calculated using the Martin-Hopkins  calculation, which is a validated novel method providing  better accuracy than the Friedewald equation in the  estimation of LDL-C.  Horald Pollen et al. Lenox Ahr. 1610;960(45): 2061-2068  (http://education.[facility].com/faq/FAQ164)    HDL  Date Value Ref Range Status  11/08/2021 67 > OR = 50 mg/dL Final    Triglycerides  Date Value Ref Range Status  11/08/2021 96 <150 mg/dL Final

## 2022-08-29 ENCOUNTER — Telehealth: Payer: Self-pay | Admitting: Family Medicine

## 2022-08-29 ENCOUNTER — Other Ambulatory Visit: Payer: Self-pay

## 2022-08-29 DIAGNOSIS — I10 Essential (primary) hypertension: Secondary | ICD-10-CM

## 2022-08-29 MED ORDER — HYDROCHLOROTHIAZIDE 25 MG PO TABS
ORAL_TABLET | ORAL | 1 refills | Status: DC
Start: 2022-08-29 — End: 2023-04-26

## 2022-08-29 NOTE — Telephone Encounter (Signed)
Prescription Request  08/29/2022  LOV: 07/27/2022  What is the name of the medication or equipment?   hydrochlorothiazide (HYDRODIURIL) 25 MG tablet   Have you contacted your pharmacy to request a refill? Yes   Which pharmacy would you like this sent to?  [id number] - [place], [place] - [address] [place] [address] Phone: [phone] Fax: [phone]    Patient notified that their request is being sent to the clinical staff for review and that they should receive a response within 2 business days.   Please advise pharmacist.

## 2022-09-12 ENCOUNTER — Ambulatory Visit: Payer: BC Managed Care – PPO | Admitting: Family Medicine

## 2022-09-12 ENCOUNTER — Encounter: Payer: Self-pay | Admitting: Family Medicine

## 2022-09-12 VITALS — BP 116/62 | HR 81 | Temp 98.1°F | Ht 64.0 in | Wt 197.8 lb

## 2022-09-12 DIAGNOSIS — R17 Unspecified jaundice: Secondary | ICD-10-CM | POA: Diagnosis not present

## 2022-09-12 DIAGNOSIS — R112 Nausea with vomiting, unspecified: Secondary | ICD-10-CM | POA: Diagnosis not present

## 2022-09-12 DIAGNOSIS — L299 Pruritus, unspecified: Secondary | ICD-10-CM

## 2022-09-12 DIAGNOSIS — R748 Abnormal levels of other serum enzymes: Secondary | ICD-10-CM | POA: Diagnosis not present

## 2022-09-12 LAB — CBC WITH DIFFERENTIAL/PLATELET
Absolute Monocytes: 485 cells/uL (ref 200–950)
Basophils Absolute: 53 cells/uL (ref 0–200)
Basophils Relative: 1.1 %
Eosinophils Absolute: 91 cells/uL (ref 15–500)
Eosinophils Relative: 1.9 %
HCT: 44.8 % (ref 35.0–45.0)
Hemoglobin: 15.7 g/dL — ABNORMAL HIGH (ref 11.7–15.5)
Lymphs Abs: 1200 cells/uL (ref 850–3900)
MCH: 35.8 pg — ABNORMAL HIGH (ref 27.0–33.0)
MCHC: 35 g/dL (ref 32.0–36.0)
MCV: 102.3 fL — ABNORMAL HIGH (ref 80.0–100.0)
MPV: 10.3 fL (ref 7.5–12.5)
Monocytes Relative: 10.1 %
Neutro Abs: 2971 cells/uL (ref 1500–7800)
Neutrophils Relative %: 61.9 %
Platelets: 427 10*3/uL — ABNORMAL HIGH (ref 140–400)
RBC: 4.38 10*6/uL (ref 3.80–5.10)
RDW: 13 % (ref 11.0–15.0)
Total Lymphocyte: 25 %
WBC: 4.8 10*3/uL (ref 3.8–10.8)

## 2022-09-12 MED ORDER — ONDANSETRON HCL 4 MG PO TABS: 4.0000 mg | ORAL_TABLET | Freq: Three times a day (TID) | ORAL | 0 refills | Status: DC | PRN

## 2022-09-12 NOTE — Progress Notes (Signed)
Subjective:    Patient ID: Kendra Jacobson, female    DOB: 1980/08/01, 42 y.o.   MRN: [id number]  07/27/22 Patient is a 42 year old Caucasian female who presents today complaining of itching all over her body.  She states that this started around Christmas time.  She been dealing with itching on a daily basis since then.  It is primarily on her back and on her chest on her torso.  It occurs on a daily basis without any visible rash.  She contacted her dermatologist and tried several things including moisturizers, changing her detergent, changing her soaps but nothing helped.  Simultaneously, she was referred to gastroenterology due to elevated liver function test.  Mother has history of autoimmune hepatitis.  Liver functions to been elevated and the most significant elevation with alkaline phosphatase which is greater than 200 where his AST and ALT were in the 40s.  Anti-smooth muscle antibodies were negative.  Hepatitis B and C were negative.  Ceruloplasmin ferritin levels were normal.  Antimitochondrial antibodies were reportedly negative.  Plan was to monitor again in 3 months.  Patient had a right upper quadrant ultrasound that was negative in August 2023.  At that time, my plan was: The patient's itching could either be neuropathic similar to notalgia paresthetica and should be responsive to hydroxyzine or gabapentin.  However, the other possibility is primary biliary cirrhosis given her elevated alkaline phosphatase, her history of autoimmune diseases, family history of autoimmune hepatitis.  For that reason I will repeat CMP today along with mitochondrial antibodies and an ANA.  If mitochondrial antibodies are elevated and the liver function test are worsening, I would recommend a biopsy to evaluate for possible primary biliary cirrhosis.  If labs are stable, she can follow-up with her gastroenterologist in 3 months as planned to repeat lab work we may try empirically starting her on  cholestyramine for itching to see if that helps.  If not we may try gabapentin at that point.  09/12/22 Since I last saw the patient, the cholestyramine is helping with the itching.  Unfortunately she now has daily nausea and vomiting.  She is lost 7 pounds.  She denies any chance of being pregnant.  She states that she just completed her menstrual cycle.  The nausea lasts all day long.  She denies any fevers or chills or diarrhea.  She denies any melena or hematochezia.  Lab work at her last visit suggested possible primary biliary cirrhosis.  I recommended a GI consultation for possible biopsy.  However the patient has not heard from GI yet.  She does not have an appointment scheduled with them. Wt Readings from Last 3 Encounters:  09/12/22 197 lb 12.8 oz (89.7 kg)  08/14/22 201 lb 6.4 oz (91.4 kg)  07/27/22 205 lb 3.2 oz (93.1 kg)    Past Medical History:  Diagnosis Date   Complication of anesthesia    hallucinated with pain medicine after surgery   Depression    Hypertension    Lupus [facility]    Dr.Beekman Rheumat[facility]   Past Surgical History:  Procedure Laterality Date   CESAREAN SECTION  04/2009   CESAREAN SECTION N/A 10/03/2014   Procedure: CESAREAN SECTION;  Surgeon: Marcelle Overlie, MD;  Location: [facility];  Service: Obstetrics;  Laterality: N/A;   MUSCLE BIOPSY Left 09/18/2012   Procedure: MUSCLE BIOPSY;  Surgeon: Lodema Pilot, DO;  Location: [facility];  Service: General;  Laterality: Left;   TONSILLECTOMY  1999   WISDOM TOOTH EXTRACTION  2000  x4   Current Outpatient Medications on File Prior to Visit  Medication Sig Dispense Refill   azaTHIOprine (IMURAN) 50 MG tablet Take 100 mg by mouth 2 times daily at 12 noon and 4 pm.     cephALEXin (KEFLEX) 500 MG capsule Take 1 capsule (500 mg total) by mouth 3 (three) times daily. 21 capsule 0   cholestyramine light (PREVALITE) 4 g packet One packet by mouth twice a day for itching. 60 packet 1   hydrochlorothiazide (HYDRODIURIL) 25 MG  tablet TAKE 1 TABLET BY MOUTH EVERY DAY 90 tablet 1   hydroxychloroquine (PLAQUENIL) 200 MG tablet Take 400 mg by mouth every evening.      metoprolol tartrate (LOPRESSOR) 25 MG tablet TAKE 1 TABLET BY MOUTH TWICE A DAY 180 tablet 1   norethindrone-ethinyl estradiol-FE (LOESTRIN FE) 1-20 MG-MCG tablet Take 1 tablet by mouth daily.     ondansetron (ZOFRAN) 4 MG tablet Take 1 tablet (4 mg total) by mouth every 8 (eight) hours as needed for nausea or vomiting. 20 tablet 0   pantoprazole (PROTONIX) 40 MG tablet Take 40 mg by mouth daily.     predniSONE (DELTASONE) 5 MG tablet Take 5 mg by mouth daily with breakfast.     vortioxetine HBr (TRINTELLIX) 10 MG TABS tablet Take 1 tablet (10 mg total) by mouth daily. 30 tablet 3   No current facility-administered medications on file prior to visit.   Allergies  Allergen Reactions   Erythromycin Other (See Comments)    Upset stomach    Social History   Socioeconomic History   Marital status: Married    Spouse name: Not on file   Number of children: Not on file   Years of education: Not on file   Highest education level: Bachelor's degree (e.g., BA, AB, BS)  Occupational History   Not on file  Tobacco Use   Smoking status: Never   Smokeless tobacco: Never  Vaping Use   Vaping status: Never Used  Substance and Sexual Activity   Alcohol use: No   Drug use: No   Sexual activity: Yes  Other Topics Concern   Not on file  Social History Narrative   Are you right handed or left handed? Right   Are you currently employed ? 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   Do you live at home alone?no   Who lives with you? Husband and 2 kids   What type of home do you live in: 1 story or 2 story? one    Caff.1 soda a day   Social Determinants of Health   Financial Resource Strain: Low Risk  (07/27/2022)   Overall Financial Resource Strain (CARDIA)    Difficulty of Paying Living Expenses: Not hard at all  Food Insecurity: No Food  Insecurity (07/27/2022)   Hunger Vital Sign    Worried About Running Out of Food in the Last Year: Never true    Ran Out of Food in the Last Year: Never true  Transportation Needs: No Transportation Needs (07/27/2022)   PRAPARE - Administrator, Civil Service (Medical): No    Lack of Transportation (Non-Medical): No  Physical Activity: Unknown (07/27/2022)   Exercise Vital Sign    Days of Exercise per Week: 0 days    Minutes of Exercise per Session: Not on file  Stress: No Stress Concern Present (07/27/2022)   [employer] of Occupational Health - Occupational Stress Questionnaire    Feeling of Stress :  Only a little  Social Connections: Socially Integrated (07/27/2022)   Social Connection and Isolation Panel [NHANES]    Frequency of Communication with Friends and Family: More than three times a week    Frequency of Social Gatherings with Friends and Family: Once a week    Attends Religious Services: More than 4 times per year    Active Member of [employer] or Organizations: Yes    Attends Engineer, structural: More than 4 times per year    Marital Status: Married  Catering manager Violence: Not on file     Review of Systems  Gastrointestinal:  Positive for abdominal pain.  All other systems reviewed and are negative.      Objective:   Physical Exam Constitutional:      General: She is not in acute distress.    Appearance: Normal appearance. She is obese. She is not ill-appearing or toxic-appearing.  HENT:     Right Ear: Tympanic membrane and ear canal normal.     Left Ear: Tympanic membrane and ear canal normal.  Eyes:     Extraocular Movements: Extraocular movements intact.     Conjunctiva/sclera: Conjunctivae normal.     Pupils: Pupils are equal, round, and reactive to light.  Cardiovascular:     Rate and Rhythm: Normal rate and regular rhythm.     Heart sounds: Normal heart sounds. No murmur heard.    No gallop.  Pulmonary:     Effort: Pulmonary  effort is normal. No respiratory distress.     Breath sounds: Normal breath sounds. No stridor. No wheezing, rhonchi or rales.  Abdominal:     General: Bowel sounds are normal. There is no distension.     Palpations: Abdomen is soft.     Tenderness: There is no abdominal tenderness. There is no guarding or rebound.  Musculoskeletal:        General: No swelling or deformity.     Cervical back: Normal range of motion and neck supple. No rigidity or tenderness.     Right lower leg: No edema.     Left lower leg: No edema.  Lymphadenopathy:     Cervical: No cervical adenopathy.  Skin:    Coloration: Skin is not jaundiced.     Findings: No bruising, erythema or lesion.  Neurological:     General: No focal deficit present.     Mental Status: She is alert and oriented to person, place, and time. Mental status is at baseline.     Cranial Nerves: No cranial nerve deficit.     Motor: No weakness.     Gait: Gait normal.     Deep Tendon Reflexes: Reflexes normal.  Psychiatric:        Mood and Affect: Mood normal.        Behavior: Behavior normal.        Thought Content: Thought content normal.        Judgment: Judgment normal.           Assessment & Plan:  Nausea and vomiting, unspecified vomiting type - Plan: CBC with Differential/Platelet, COMPLETE METABOLIC PANEL WITH GFR, Lipase, C-reactive protein  Itching  Elevated bilirubin  Elevated alkaline phosphatase level I suspect that the nausea and vomiting is likely a systemic symptom due to the underlying autoimmune process.  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  I feel that she  needs to see GI soon as possible to arrange a liver biopsy to confirm this.  Will recheck a CBC a CMP and a lipase to see if liver function test have worsened further which would confirm my suspicion.   Meanwhile treat the patient symptomatically with cholestyramine for itching and Zofran for nausea.  Follow-up and ensure GI consult.

## 2022-09-14 ENCOUNTER — Telehealth: Payer: Self-pay

## 2022-09-14 NOTE — Telephone Encounter (Signed)
Pt advised and verbalized understanding of all. Mjp,lpn  Donita Brooks, MD  Darral Dash, LPN Notify patient I have spoke with GI and they will be reaching out to her to discuss biopsy.       Previous Messages    ----- Message ----- From: Kerin Salen, MD Sent: 09/14/2022  11:51 AM EDT To: Donita Brooks, MD Subject: RE:                                            Her AMA was negative. It is possible for patients to have AMA negative PBC, which will need biopsy of liver. I will schedule that. Thanks, Marcos Eke ----- Message ----- From: Donita Brooks, MD Sent: 09/14/2022  11:34 AM EDT To: Kerin Salen, MD  Dr. Marca Ancona, I had sent over a referral about our mutual patient in late June.  I am concerned she may have primary biliary cirrhosis (my notes in epic).  Her bilirubin and alk phos continue to rise, now she is feeling nauseated every day.  Thankfully itching responded to cholestyramine.  We contacted your office about the status of the referral and the staff told me its in your box.  I was hoping to get her in as soon as possible or to get you opinion.   Thanks Elijah Birk

## 2022-09-15 ENCOUNTER — Other Ambulatory Visit (HOSPITAL_COMMUNITY): Payer: Self-pay | Admitting: Gastroenterology

## 2022-09-15 DIAGNOSIS — R748 Abnormal levels of other serum enzymes: Secondary | ICD-10-CM

## 2022-10-05 ENCOUNTER — Other Ambulatory Visit: Payer: Self-pay | Admitting: Student

## 2022-10-05 DIAGNOSIS — Z01812 Encounter for preprocedural laboratory examination: Secondary | ICD-10-CM

## 2022-10-05 NOTE — Progress Notes (Signed)
Left VM on 10/05/18 at 130p for patient to arrive tomorrow at 730 for her 830 appointment. She is to be NPO after midnight and needs a driver bc she is getting moderate sedation.

## 2022-10-06 ENCOUNTER — Ambulatory Visit
Admission: RE | Admit: 2022-10-06 | Discharge: 2022-10-06 | Disposition: A | Discharge status: Home / Self Care | Payer: BC Managed Care – PPO | Source: Ambulatory Visit | Attending: Gastroenterology | Admitting: Gastroenterology

## 2022-10-06 DIAGNOSIS — M329 Systemic lupus erythematosus, unspecified: Secondary | ICD-10-CM | POA: Insufficient documentation

## 2022-10-06 DIAGNOSIS — F32A Depression, unspecified: Secondary | ICD-10-CM | POA: Insufficient documentation

## 2022-10-06 DIAGNOSIS — I10 Essential (primary) hypertension: Secondary | ICD-10-CM | POA: Diagnosis not present

## 2022-10-06 DIAGNOSIS — K760 Fatty (change of) liver, not elsewhere classified: Secondary | ICD-10-CM | POA: Diagnosis not present

## 2022-10-06 DIAGNOSIS — R748 Abnormal levels of other serum enzymes: Secondary | ICD-10-CM

## 2022-10-06 DIAGNOSIS — K7589 Other specified inflammatory liver diseases: Secondary | ICD-10-CM | POA: Diagnosis not present

## 2022-10-06 DIAGNOSIS — Z01812 Encounter for preprocedural laboratory examination: Secondary | ICD-10-CM

## 2022-10-06 LAB — CBC
HCT: 46.4 % — ABNORMAL HIGH (ref 36.0–46.0)
Hemoglobin: 15.5 g/dL — ABNORMAL HIGH (ref 12.0–15.0)
MCH: 35.2 pg — ABNORMAL HIGH (ref 26.0–34.0)
MCHC: 33.4 g/dL (ref 30.0–36.0)
MCV: 105.5 fL — ABNORMAL HIGH (ref 80.0–100.0)
Platelets: 309 10*3/uL (ref 150–400)
RBC: 4.4 MIL/uL (ref 3.87–5.11)
RDW: 12.9 % (ref 11.5–15.5)
WBC: 4.3 10*3/uL (ref 4.0–10.5)
nRBC: 0 % (ref 0.0–0.2)

## 2022-10-06 LAB — PROTIME-INR
INR: 1.1 (ref 0.8–1.2)
Prothrombin Time: 14.7 seconds (ref 11.4–15.2)

## 2022-10-06 MED ORDER — OXYCODONE HCL 5 MG PO TABS
ORAL_TABLET | ORAL | Status: AC
  Filled 2022-10-06: qty 2

## 2022-10-06 MED ORDER — MIDAZOLAM HCL 2 MG/2ML IJ SOLN
INTRAMUSCULAR | Status: AC
  Filled 2022-10-06: qty 2

## 2022-10-06 MED ORDER — FENTANYL CITRATE (PF) 100 MCG/2ML IJ SOLN
INTRAMUSCULAR | Status: AC | PRN
Start: 2022-10-06 — End: 2022-10-06
  Administered 2022-10-06 (×2): 50 ug via INTRAVENOUS

## 2022-10-06 MED ORDER — SODIUM CHLORIDE 0.9 % IV SOLN: INTRAVENOUS | Status: DC

## 2022-10-06 MED ORDER — FENTANYL CITRATE (PF) 100 MCG/2ML IJ SOLN
INTRAMUSCULAR | Status: AC
  Filled 2022-10-06: qty 2

## 2022-10-06 MED ORDER — OXYCODONE HCL 5 MG PO TABS
10.0000 mg | ORAL_TABLET | ORAL | Status: DC | PRN
  Administered 2022-10-06: 10 mg via ORAL

## 2022-10-06 MED ORDER — MIDAZOLAM HCL 2 MG/2ML IJ SOLN
INTRAMUSCULAR | Status: AC | PRN
  Administered 2022-10-06 (×2): 1 mg via INTRAVENOUS

## 2022-10-06 MED ORDER — LIDOCAINE HCL (PF) 1 % IJ SOLN
10.0000 mL | Freq: Once | INTRAMUSCULAR | Status: AC
  Administered 2022-10-06: 10 mL via INTRADERMAL
  Filled 2022-10-06: qty 10

## 2022-10-06 NOTE — H&P (Signed)
Chief Complaint: Patient was seen in consultation today for image-guided liver biopsy  Referring Physician(s): Kerin Salen  Supervising Physician: Roanna Banning  Patient Status: [facility] - Out-pt  History of Present Illness: Kendra Jacobson is a 42 y.o. female with PMH significant for depression, hypertension, and lupus being seen today for an image-guided liver biopsy. 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 Patient has been referred to IR for image-guided liver biopsy.   Past Medical History:  Diagnosis Date   Complication of anesthesia    hallucinated with pain medicine after surgery   Depression    Hypertension    Lupus [facility])    Dr.Beekman Rheumatology    Past Surgical History:  Procedure Laterality Date   CESAREAN SECTION  04/2009   CESAREAN SECTION N/A 10/03/2014   Procedure: CESAREAN SECTION;  Surgeon: Marcelle Overlie, MD;  Location: [facility];  Service: Obstetrics;  Laterality: N/A;   MUSCLE BIOPSY Left 09/18/2012   Procedure: MUSCLE BIOPSY;  Surgeon: Lodema Pilot, DO;  Location: [facility];  Service: General;  Laterality: Left;   TONSILLECTOMY  1999   WISDOM TOOTH EXTRACTION  2000   x4    Allergies: Erythromycin  Medications: Prior to Admission medications   Medication Sig Start Date End Date Taking? Authorizing Provider  azaTHIOprine (IMURAN) 50 MG tablet Take 100 mg by mouth 2 times daily at 12 noon and 4 pm.    [provider]  cholestyramine light (PREVALITE) 4 g packet One packet by mouth twice a day for itching. 08/01/22   Donita Brooks, MD  hydrochlorothiazide (HYDRODIURIL) 25 MG tablet TAKE 1 TABLET BY MOUTH EVERY DAY 08/29/22   Donita Brooks, MD  hydroxychloroquine (PLAQUENIL) 200 MG tablet Take 400 mg by mouth every evening.     [provider]  JUNEL 1/20 1-20 MG-MCG tablet  09/10/22   [provider]   metoprolol tartrate (LOPRESSOR) 25 MG tablet TAKE 1 TABLET BY MOUTH TWICE A DAY 05/24/22   Donita Brooks, MD  norethindrone-ethinyl estradiol-FE (LOESTRIN FE) 1-20 MG-MCG tablet Take 1 tablet by mouth daily.    [provider]  ondansetron (ZOFRAN) 4 MG tablet Take 1 tablet (4 mg total) by mouth every 8 (eight) hours as needed for nausea or vomiting. 09/12/22   Donita Brooks, MD  pantoprazole (PROTONIX) 40 MG tablet Take 40 mg by mouth daily. 06/23/22   [provider]  predniSONE (DELTASONE) 5 MG tablet Take 5 mg by mouth daily with breakfast.    [provider]  vortioxetine HBr (TRINTELLIX) 10 MG TABS tablet Take 1 tablet (10 mg total) by mouth daily. 06/29/22   Donita Brooks, MD     Family History  Problem Relation Age of Onset   Asthma Mother    Hypertension Mother    Parkinson's disease Father    Hyperlipidemia Father    Hypertension Maternal Aunt    Hypertension Maternal Uncle     Social History   Socioeconomic History   Marital status: Married    Spouse name: Not on file   Number of children: Not on file   Years of education: Not on file   Highest education level: Bachelor's degree (e.g., BA, AB, BS)  Occupational History   Not on file  Tobacco Use   Smoking status: Never   Smokeless tobacco: Never  Vaping Use   Vaping status: Never Used  Substance and Sexual  Activity   Alcohol use: No   Drug use: No   Sexual activity: Yes  Other Topics Concern   Not on file  Social History Narrative   Are you right handed or left handed? Right   Are you currently employed ? 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   Do you live at home alone?no   Who lives with you? Husband and 2 kids   What type of home do you live in: 1 story or 2 story? one    Caff.1 soda a day   Social Determinants of Health   Financial Resource Strain: Low Risk  (07/27/2022)   Overall Financial Resource Strain (CARDIA)    Difficulty of Paying Living  Expenses: Not hard at all  Food Insecurity: No Food Insecurity (07/27/2022)   Hunger Vital Sign    Worried About Running Out of Food in the Last Year: Never true    Ran Out of Food in the Last Year: Never true  Transportation Needs: No Transportation Needs (07/27/2022)   PRAPARE - Administrator, Civil Service (Medical): No    Lack of Transportation (Non-Medical): No  Physical Activity: Unknown (07/27/2022)   Exercise Vital Sign    Days of Exercise per Week: 0 days    Minutes of Exercise per Session: Not on file  Stress: No Stress Concern Present (07/27/2022)   [employer] of Occupational Health - Occupational Stress Questionnaire    Feeling of Stress : Only a little  Social Connections: Socially Integrated (07/27/2022)   Social Connection and Isolation Panel [NHANES]    Frequency of Communication with Friends and Family: More than three times a week    Frequency of Social Gatherings with Friends and Family: Once a week    Attends Religious Services: More than 4 times per year    Active Member of [employer] or Organizations: Yes    Attends Engineer, structural: More than 4 times per year    Marital Status: Married    Code Status: Full code  Review of Systems: A 12 point ROS discussed and pertinent positives are indicated in the HPI above.  All other systems are negative.  Review of Systems  Constitutional:  Negative for chills and fever.  Respiratory:  Negative for chest tightness and shortness of breath.   Cardiovascular:  Negative for chest pain and leg swelling.  Gastrointestinal:  Positive for nausea. Negative for abdominal pain, diarrhea and vomiting.  Neurological:  Positive for dizziness. Negative for headaches.       Patient reports feeling dizzy yesterday after being outside for an extended period of time in the heat  Psychiatric/Behavioral:  Negative for confusion.     Vital Signs: BP (!) 137/92   Pulse 65   Temp 98 F (36.7 C)   Resp 19   Ht 5'  4" (1.626 m)   Wt 195 lb (88.5 kg)   LMP 09/10/2022   SpO2 97%   BMI 33.47 kg/m      Physical Exam Vitals reviewed.  Constitutional:      General: She is not in acute distress.    Appearance: She is not ill-appearing.  HENT:     Mouth/Throat:     Mouth: Mucous membranes are moist.  Cardiovascular:     Rate and Rhythm: Normal rate and regular rhythm.     Pulses: Normal pulses.     Heart sounds: Normal heart sounds.  Pulmonary:     Effort: Pulmonary effort is  normal.     Breath sounds: Normal breath sounds.  Abdominal:     Palpations: Abdomen is soft.     Tenderness: There is no abdominal tenderness.  Musculoskeletal:     Right lower leg: No edema.     Left lower leg: No edema.  Skin:    General: Skin is warm and dry.  Neurological:     Mental Status: She is alert and oriented to person, place, and time.  Psychiatric:        Mood and Affect: Mood normal.        Behavior: Behavior normal.        Thought Content: Thought content normal.        Judgment: Judgment normal.     Imaging: No results found.  Labs:  CBC: Recent Labs    11/08/21 1627 09/12/22 1240  WBC 6.2 4.8  HGB 15.1 15.7*  HCT 42.7 44.8  PLT 349 427*    COAGS: No results for input(s): "INR", "APTT" in the last 8760 hours.  BMP: Recent Labs    11/08/21 1627 07/27/22 1139 09/12/22 1240  NA 138 140 139  K 3.8 4.0 4.1  CL 99 107 102  CO2 27 24 27   GLUCOSE 107* 88 84  BUN 13 11 13   CALCIUM 9.0 8.8 9.4  CREATININE 0.71 0.61 0.75    LIVER FUNCTION TESTS: Recent Labs    11/08/21 1627 07/27/22 1139 09/12/22 1240  BILITOT 0.7 1.3* 2.5*  AST 44* 50* 66*  ALT 41* 50* 85*  PROT 6.2 6.0* 6.1    TUMOR MARKERS: No results for input(s): "AFPTM", "CEA", "CA199", "CHROMGRNA" in the last 8760 hours.  Assessment and Plan:  Kendra Jacobson is a 42 yo female being seen today for an image-guided liver biopsy. Patient presents today in her usual state of health and is NPO. Case has  been reviewed with Dr Milford Cage and is scheduled to proceed on 10/06/22 with moderate sedation. Pre-procedural labs are pending.  Risks and benefits of image-guided liver biopsy was discussed with the patient and/or patient's family including, but not limited to bleeding, infection, damage to adjacent structures or low yield requiring additional tests.  All of the questions were answered and there is agreement to proceed.  Consent signed and in chart.   Kennieth Francois, PA-C 10/06/2022 7:44 AM     Thank you for this interesting consult.  I greatly enjoyed meeting Kendra Jacobson and look forward to participating in their care.  A copy of this report was sent to the requesting provider on this date.  Electronically Signed: Kennieth Francois, PA-C 10/06/2022, 7:37 AM   I spent a total of  15 Minutes   in face to face in clinical consultation, greater than 50% of which was counseling/coordinating care for image-guided liver biopsy.

## 2022-10-06 NOTE — Procedures (Signed)
Vascular and Interventional Radiology Procedure Note  Patient: MODESTY BELGRAVE DOB: 11-05-1980 Medical Record Number: [id number] Note Date/Time: 10/06/22 8:56 AM   Performing Physician: Roanna Banning, MD Assistant(s): None  Diagnosis: > ALP. Q PBC   Procedure: LIVER BIOPSY, NON-TARGETED   Anesthesia: Conscious Sedation Complications: None Estimated Blood Loss: Minimal Specimens: Sent for Pathology  Findings:  Successful Ultrasound-guided biopsy of liver. A total of 3 samples were obtained. Hemostasis of the tract was achieved using Gelfoam Slurry Embolization.  Plan: Bed rest for 2 hours.  See detailed procedure note with images in PACS. The patient tolerated the procedure well without incident or complication and was returned to Recovery in stable condition.    Roanna Banning, MD Vascular and Interventional Radiology Specialists [facility] Radiology   Pager. [phone] Clinic. [phone]

## 2022-10-06 NOTE — Progress Notes (Signed)
Patient clinically stable post US Liver biopsy per Dr Milford Cage, tolerated well with vitals stable pre and post procedure. Received Versed 2 mg along with Fentanyl 100 mcg IV for procedure. Report given to Orlinda Blalock RN post procedure/16.

## 2022-10-18 ENCOUNTER — Ambulatory Visit: Payer: BC Managed Care – PPO | Admitting: Dietician

## 2022-10-19 ENCOUNTER — Encounter: Payer: Self-pay | Admitting: Family Medicine

## 2022-10-22 ENCOUNTER — Other Ambulatory Visit: Payer: Self-pay | Admitting: Family Medicine

## 2022-10-22 DIAGNOSIS — L299 Pruritus, unspecified: Secondary | ICD-10-CM

## 2022-10-22 DIAGNOSIS — R7989 Other specified abnormal findings of blood chemistry: Secondary | ICD-10-CM

## 2022-10-22 DIAGNOSIS — R17 Unspecified jaundice: Secondary | ICD-10-CM

## 2022-10-23 NOTE — Telephone Encounter (Signed)
Requested Prescriptions  Pending Prescriptions Disp Refills   cholestyramine light (PREVALITE) 4 g packet [Pharmacy Med Name: CHOLESTYRAMINE LIGHT PACKET] 60 packet 1    Sig: ONE PACKET BY MOUTH TWICE A DAY FOR ITCHING.     Cardiovascular:  Antilipid - Bile Acid Sequestrants Failed - 10/22/2022  8:46 AM      Failed - Valid encounter within last 12 months    Recent Outpatient Visits           2 years ago Benign essential HTN   [facility] Tanya Nones, Priscille Heidelberg, MD   3 years ago Bronchitis with acute wheezing   [facility] Donita Brooks, MD   7 years ago Acute maxillary sinusitis, recurrence not specified   [facility] [place], Velna Hatchet, MD   8 years ago Morbid obesity   [facility] Donita Brooks, MD   8 years ago Acute cystitis without hematuria   [facility] [place], Velna Hatchet, MD       Future Appointments             In 3 weeks Tanya Nones, Priscille Heidelberg, MD [facility]            Failed - Lipid Panel in normal range within the last 12 months    Cholesterol  Date Value Ref Range Status  11/08/2021 215 (H) <200 mg/dL Final   LDL Cholesterol (Calc)  Date Value Ref Range Status  11/08/2021 128 (H) mg/dL (calc) Final    Comment:    Reference range: <100 . Desirable range <100 mg/dL for primary prevention;   <70 mg/dL for patients with CHD or diabetic patients  with > or = 2 CHD risk factors. Marland Kitchen LDL-C is now calculated using the Martin-Hopkins  calculation, which is a validated novel method providing  better accuracy than the Friedewald equation in the  estimation of LDL-C.  Horald Pollen et al. Lenox Ahr. 2956;213(08): 2061-2068  (http://education.[facility].com/faq/FAQ164)    HDL  Date Value Ref Range Status  11/08/2021 67 > OR = 50 mg/dL Final   Triglycerides  Date Value Ref Range Status  11/08/2021 96 <150 mg/dL Final

## 2022-11-01 ENCOUNTER — Other Ambulatory Visit: Payer: Self-pay | Admitting: Family Medicine

## 2022-11-02 NOTE — Telephone Encounter (Signed)
Requested Prescriptions  Pending Prescriptions Disp Refills   TRINTELLIX 10 MG TABS tablet [Pharmacy Med Name: TRINTELLIX 10 MG TABLET] 30 tablet 3    Sig: TAKE 1 TABLET BY MOUTH EVERY DAY     Psychiatry: Antidepressants - Serotonin Modulator Failed - 11/01/2022  8:23 AM      Failed - Valid encounter within last 6 months    Recent Outpatient Visits           2 years ago Benign essential HTN   [facility] Donita Brooks, MD   3 years ago Bronchitis with acute wheezing   [facility] Donita Brooks, MD   7 years ago Acute maxillary sinusitis, recurrence not specified   [facility] [place], Velna Hatchet, MD   8 years ago Morbid obesity   [facility] Donita Brooks, MD   8 years ago Acute cystitis without hematuria   [facility] [place], Velna Hatchet, MD       Future Appointments             In 1 week Pickard, Priscille Heidelberg, MD [facility], [facility]            Passed - Completed PHQ-2 or PHQ-9 in the last 360 days

## 2022-11-08 ENCOUNTER — Other Ambulatory Visit: Payer: Self-pay

## 2022-11-08 DIAGNOSIS — R17 Unspecified jaundice: Secondary | ICD-10-CM

## 2022-11-08 DIAGNOSIS — L299 Pruritus, unspecified: Secondary | ICD-10-CM

## 2022-11-08 DIAGNOSIS — R7989 Other specified abnormal findings of blood chemistry: Secondary | ICD-10-CM

## 2022-11-08 MED ORDER — CHOLESTYRAMINE LIGHT 4 G PO PACK: PACK | ORAL | 0 refills | Status: DC

## 2022-11-08 NOTE — Telephone Encounter (Signed)
Requested Prescriptions  Pending Prescriptions Disp Refills   cholestyramine light (PREVALITE) 4 g packet 60 packet 0    Sig: One packet by mouth twice a day for itching.     Cardiovascular:  Antilipid - Bile Acid Sequestrants Failed - 11/08/2022 10:19 AM      Failed - Valid encounter within last 12 months    Recent Outpatient Visits           2 years ago Benign essential HTN   [facility] Tanya Nones, Priscille Heidelberg, MD   3 years ago Bronchitis with acute wheezing   [facility] Donita Brooks, MD   7 years ago Acute maxillary sinusitis, recurrence not specified   [facility] [place], Velna Hatchet, MD   8 years ago Morbid obesity   [facility] Donita Brooks, MD   8 years ago Acute cystitis without hematuria   [facility] [place], Velna Hatchet, MD       Future Appointments             In 6 days Donita Brooks, MD [facility], [facility]            Failed - Lipid Panel in normal range within the last 12 months    Cholesterol  Date Value Ref Range Status  11/08/2021 215 (H) <200 mg/dL Final   LDL Cholesterol (Calc)  Date Value Ref Range Status  11/08/2021 128 (H) mg/dL (calc) Final    Comment:    Reference range: <100 . Desirable range <100 mg/dL for primary prevention;   <70 mg/dL for patients with CHD or diabetic patients  with > or = 2 CHD risk factors. Marland Kitchen LDL-C is now calculated using the Martin-Hopkins  calculation, which is a validated novel method providing  better accuracy than the Friedewald equation in the  estimation of LDL-C.  Horald Pollen et al. Lenox Ahr. 2130;865(78): 2061-2068  (http://education.[facility].com/faq/FAQ164)    HDL  Date Value Ref Range Status  11/08/2021 67 > OR = 50 mg/dL Final   Triglycerides  Date Value Ref Range Status  11/08/2021 96 <150 mg/dL Final

## 2022-11-08 NOTE — Telephone Encounter (Signed)
Prescription Request  11/08/2022  LOV: upcoming CPE and labs 11/14/22  What is the name of the medication or equipment? cholestyramine light (PREVALITE) 4 g packet [[id number]]  Have you contacted your pharmacy to request a refill? Yes   Which pharmacy would you like this sent to?  [id number] - [place],  - [address] [place] [address] Phone: [phone] Fax: [phone]    Patient notified that their request is being sent to the clinical staff for review and that they should receive a response within 2 business days.   Please advise at [facility] [phone]

## 2022-11-09 ENCOUNTER — Encounter: Payer: Self-pay | Admitting: Family Medicine

## 2022-11-09 ENCOUNTER — Other Ambulatory Visit: Payer: BC Managed Care – PPO

## 2022-11-09 DIAGNOSIS — R350 Frequency of micturition: Secondary | ICD-10-CM

## 2022-11-10 ENCOUNTER — Encounter: Payer: Self-pay | Admitting: Family Medicine

## 2022-11-10 ENCOUNTER — Other Ambulatory Visit: Payer: Self-pay | Admitting: Family Medicine

## 2022-11-10 LAB — URINALYSIS, ROUTINE W REFLEX MICROSCOPIC
Bilirubin Urine: NEGATIVE
Glucose, UA: NEGATIVE
Hgb urine dipstick: NEGATIVE
Ketones, ur: NEGATIVE
Nitrite: POSITIVE — AB
Specific Gravity, Urine: 1.024 (ref 1.001–1.035)
pH: 6.5 (ref 5.0–8.0)

## 2022-11-10 LAB — MICROSCOPIC MESSAGE

## 2022-11-10 MED ORDER — CEPHALEXIN 500 MG PO CAPS: 500.0000 mg | ORAL_CAPSULE | Freq: Three times a day (TID) | ORAL | 0 refills | Status: DC

## 2022-11-11 LAB — URINE CULTURE
MICRO NUMBER:: 15548259
SPECIMEN QUALITY:: ADEQUATE

## 2022-11-14 ENCOUNTER — Ambulatory Visit (INDEPENDENT_AMBULATORY_CARE_PROVIDER_SITE_OTHER): Payer: BC Managed Care – PPO | Admitting: Family Medicine

## 2022-11-14 ENCOUNTER — Encounter: Payer: Self-pay | Admitting: Family Medicine

## 2022-11-14 VITALS — BP 128/82 | HR 83 | Temp 98.0°F | Ht 64.0 in | Wt 195.4 lb

## 2022-11-14 DIAGNOSIS — I10 Essential (primary) hypertension: Secondary | ICD-10-CM | POA: Diagnosis not present

## 2022-11-14 DIAGNOSIS — Z0001 Encounter for general adult medical examination with abnormal findings: Secondary | ICD-10-CM

## 2022-11-14 DIAGNOSIS — E66811 Obesity, class 1: Secondary | ICD-10-CM | POA: Diagnosis not present

## 2022-11-14 DIAGNOSIS — K7581 Nonalcoholic steatohepatitis (NASH): Secondary | ICD-10-CM

## 2022-11-14 DIAGNOSIS — Z0000 Encounter for general adult medical examination without abnormal findings: Secondary | ICD-10-CM

## 2022-11-14 MED ORDER — WEGOVY 0.5 MG/0.5ML ~~LOC~~ SOAJ: 0.5000 mg | SUBCUTANEOUS | 1 refills | Status: DC

## 2022-11-14 NOTE — Progress Notes (Signed)
Subjective:    Patient ID: Kendra Jacobson, female    DOB: [birth year], 42 y.o.   MRN: [id number]  HPI Patient is a 42 year old Caucasian female here today for complete physical exam.  She gets her Pap smear, mammogram, and pelvic exam through her gynecologist.  Recently she has been dealing with diffuse pruritus.  Bilirubin level and liver functions test were significant dated.  Patient underwent a biopsy which showed hepatic steatosis consistent with fatty liver the.  Thank primary.  Cirrhosis rule out.  However the patient is dealing with significant inflammation in her liver and is at high risk progressing to cirrhosis.  Therefore it is critical for her to lose weight.  Despite exercise and diet, the patient has been unsuccessful in weight loss.  Her BMI remains greater than 32.  She is also dealing with hypertension as well as lupus Past Medical History:  Diagnosis Date   Complication of anesthesia    hallucinated with pain medicine after surgery   Depression    Hypertension    Lupus    Dr.Beekman Rheumatology   Past Surgical History:  Procedure Laterality Date   CESAREAN SECTION  04/2009   CESAREAN SECTION N/A 10/03/2014   Procedure: CESAREAN SECTION;  Surgeon: Marcelle Overlie, MD;  Location: [facility];  Service: Obstetrics;  Laterality: N/A;   MUSCLE BIOPSY Left 09/18/2012   Procedure: MUSCLE BIOPSY;  Surgeon: Lodema Pilot, DO;  Location: [facility];  Service: General;  Laterality: Left;   TONSILLECTOMY  1999   WISDOM TOOTH EXTRACTION  2000   x4   Current Outpatient Medications on File Prior to Visit  Medication Sig Dispense Refill   azaTHIOprine (IMURAN) 50 MG tablet Take 100 mg by mouth 2 times daily at 12 noon and 4 pm.     cephALEXin (KEFLEX) 500 MG capsule Take 1 capsule (500 mg total) by mouth 3 (three) times daily. 21 capsule 0   cholestyramine light (PREVALITE) 4 g packet One packet by mouth twice a day for itching. 60 packet 0   hydrochlorothiazide (HYDRODIURIL) 25 MG  tablet TAKE 1 TABLET BY MOUTH EVERY DAY 90 tablet 1   hydroxychloroquine (PLAQUENIL) 200 MG tablet Take 400 mg by mouth every evening.      JUNEL 1/20 1-20 MG-MCG tablet      metoprolol tartrate (LOPRESSOR) 25 MG tablet TAKE 1 TABLET BY MOUTH TWICE A DAY 180 tablet 1   norethindrone-ethinyl estradiol-FE (LOESTRIN FE) 1-20 MG-MCG tablet Take 1 tablet by mouth daily.     ondansetron (ZOFRAN) 4 MG tablet Take 1 tablet (4 mg total) by mouth every 8 (eight) hours as needed for nausea or vomiting. 30 tablet 0   predniSONE (DELTASONE) 5 MG tablet Take 5 mg by mouth daily with breakfast.     TRINTELLIX 10 MG TABS tablet TAKE 1 TABLET BY MOUTH EVERY DAY 30 tablet 3   No current facility-administered medications on file prior to visit.   Allergies  Allergen Reactions   Erythromycin Other (See Comments)    Upset stomach    Social History   Socioeconomic History   Marital status: Married    Spouse name: Not on file   Number of children: Not on file   Years of education: Not on file   Highest education level: Bachelor's degree (e.g., BA, AB, BS)  Occupational History   Not on file  Tobacco Use   Smoking status: Never   Smokeless tobacco: Never  Vaping Use   Vaping status: Never Used  Substance  and Sexual Activity   Alcohol use: No   Drug use: No   Sexual activity: Yes  Other Topics Concern   Not on file  Social History Narrative   Are you right handed or left handed? Right   Are you currently employed ? 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   Do you live at home alone?no   Who lives with you? Husband and 2 kids   What type of home do you live in: 1 story or 2 story? one    Caff.1 soda a day   Social Determinants of Health   Financial Resource Strain: Low Risk  (07/27/2022)   Overall Financial Resource Strain (CARDIA)    Difficulty of Paying Living Expenses: Not hard at all  Food Insecurity: No Food Insecurity (07/27/2022)   Hunger Vital Sign    Worried About  Running Out of Food in the Last Year: Never true    Ran Out of Food in the Last Year: Never true  Transportation Needs: No Transportation Needs (07/27/2022)   PRAPARE - Administrator, Civil Service (Medical): No    Lack of Transportation (Non-Medical): No  Physical Activity: Unknown (07/27/2022)   Exercise Vital Sign    Days of Exercise per Week: 0 days    Minutes of Exercise per Session: Not on file  Stress: No Stress Concern Present (07/27/2022)   [employer] of Occupational Health - Occupational Stress Questionnaire    Feeling of Stress : Only a little  Social Connections: Socially Integrated (07/27/2022)   Social Connection and Isolation Panel [NHANES]    Frequency of Communication with Friends and Family: More than three times a week    Frequency of Social Gatherings with Friends and Family: Once a week    Attends Religious Services: More than 4 times per year    Active Member of [employer] or Organizations: Yes    Attends Engineer, structural: More than 4 times per year    Marital Status: Married  Catering manager Violence: Not on file     Review of Systems  All other systems reviewed and are negative.      Objective:   Physical Exam Constitutional:      General: She is not in acute distress.    Appearance: Normal appearance. She is obese. She is not ill-appearing or toxic-appearing.  HENT:     Right Ear: Tympanic membrane and ear canal normal.     Left Ear: Tympanic membrane and ear canal normal.  Eyes:     Extraocular Movements: Extraocular movements intact.     Conjunctiva/sclera: Conjunctivae normal.     Pupils: Pupils are equal, round, and reactive to light.  Cardiovascular:     Rate and Rhythm: Normal rate and regular rhythm.     Heart sounds: Normal heart sounds. No murmur heard.    No gallop.  Pulmonary:     Effort: Pulmonary effort is normal. No respiratory distress.     Breath sounds: Normal breath sounds. No stridor. No wheezing,  rhonchi or rales.  Abdominal:     General: Bowel sounds are normal. There is no distension.     Palpations: Abdomen is soft.     Tenderness: There is no abdominal tenderness. There is no guarding or rebound.  Musculoskeletal:        General: No swelling or deformity.     Cervical back: Normal range of motion and neck supple. No rigidity or tenderness.  Right lower leg: No edema.     Left lower leg: No edema.  Lymphadenopathy:     Cervical: No cervical adenopathy.  Skin:    Coloration: Skin is not jaundiced.     Findings: No bruising, erythema or lesion.  Neurological:     General: No focal deficit present.     Mental Status: She is alert and oriented to person, place, and time. Mental status is at baseline.     Cranial Nerves: No cranial nerve deficit.     Motor: No weakness.     Gait: Gait normal.     Deep Tendon Reflexes: Reflexes normal.  Psychiatric:        Mood and Affect: Mood normal.        Behavior: Behavior normal.        Thought Content: Thought content normal.        Judgment: Judgment normal.           Assessment & Plan:  NASH (nonalcoholic steatohepatitis) - Plan: CBC with Differential/Platelet, COMPLETE METABOLIC PANEL WITH GFR, Lipid panel, Hemoglobin A1c  Benign essential HTN  Obesity (BMI 30.0-34.9)  General medical exam Patient would drastically benefit from weight loss as I am concerned that she is going to progress cirrhosis due to fatty liver disease and therefore recommend trying COVID.  I will see if I can get Wegovy approved by her insurance given her fatty liver disease and hypertension and obesity.  If not we discussed compounding pharmacies.  I will also check a CBC a CMP a lipid panel and an A1c today.  Patient is getting her flu shot through work.  I recommended a COVID booster.  Mammogram and Pap smear up-to-date.  Patient has a family history of colon cancer in her sister at age 93.  Patient has already had a colonoscopy.  She is due for  another colonoscopy in 4 years.

## 2022-11-15 ENCOUNTER — Encounter: Payer: Self-pay | Admitting: Family Medicine

## 2022-11-15 LAB — LIPID PANEL
Cholesterol: 240 mg/dL — ABNORMAL HIGH
HDL: 51 mg/dL
LDL Cholesterol (Calc): 166 mg/dL — ABNORMAL HIGH
Non-HDL Cholesterol (Calc): 189 mg/dL — ABNORMAL HIGH
Total CHOL/HDL Ratio: 4.7 (calc)
Triglycerides: 110 mg/dL

## 2022-11-15 LAB — COMPLETE METABOLIC PANEL WITHOUT GFR
AG Ratio: 1.7 (calc) (ref 1.0–2.5)
ALT: 35 U/L — ABNORMAL HIGH (ref 6–29)
AST: 35 U/L — ABNORMAL HIGH (ref 10–30)
Albumin: 3.7 g/dL (ref 3.6–5.1)
Alkaline phosphatase (APISO): 151 U/L — ABNORMAL HIGH (ref 31–125)
BUN: 12 mg/dL (ref 7–25)
CO2: 30 mmol/L (ref 20–32)
Calcium: 9.2 mg/dL (ref 8.6–10.2)
Chloride: 100 mmol/L (ref 98–110)
Creat: 0.66 mg/dL (ref 0.50–0.99)
Globulin: 2.2 g/dL (ref 1.9–3.7)
Glucose, Bld: 124 mg/dL — ABNORMAL HIGH (ref 65–99)
Potassium: 3.9 mmol/L (ref 3.5–5.3)
Sodium: 139 mmol/L (ref 135–146)
Total Bilirubin: 1.5 mg/dL — ABNORMAL HIGH (ref 0.2–1.2)
Total Protein: 5.9 g/dL — ABNORMAL LOW (ref 6.1–8.1)
eGFR: 112 mL/min/1.73m2

## 2022-11-15 LAB — HEMOGLOBIN A1C
Hgb A1c MFr Bld: 5.1 %{Hb} (ref <5.7)
Mean Plasma Glucose: 100 mg/dL
eAG (mmol/L): 5.5 mmol/L

## 2022-11-15 LAB — CBC WITH DIFFERENTIAL/PLATELET
Absolute Monocytes: 435 {cells}/uL (ref 200–950)
Basophils Absolute: 21 {cells}/uL (ref 0–200)
Basophils Relative: 0.4 %
Eosinophils Absolute: 101 {cells}/uL (ref 15–500)
Eosinophils Relative: 1.9 %
HCT: 42.6 % (ref 35.0–45.0)
Hemoglobin: 14.6 g/dL (ref 11.7–15.5)
Lymphs Abs: 1261 {cells}/uL (ref 850–3900)
MCH: 34.8 pg — ABNORMAL HIGH (ref 27.0–33.0)
MCHC: 34.3 g/dL (ref 32.0–36.0)
MCV: 101.4 fL — ABNORMAL HIGH (ref 80.0–100.0)
MPV: 10 fL (ref 7.5–12.5)
Monocytes Relative: 8.2 %
Neutro Abs: 3482 {cells}/uL (ref 1500–7800)
Neutrophils Relative %: 65.7 %
Platelets: 366 Thousand/uL (ref 140–400)
RBC: 4.2 Million/uL (ref 3.80–5.10)
RDW: 11.9 % (ref 11.0–15.0)
Total Lymphocyte: 23.8 %
WBC: 5.3 Thousand/uL (ref 3.8–10.8)

## 2022-11-17 ENCOUNTER — Other Ambulatory Visit: Payer: Self-pay | Admitting: Family Medicine

## 2022-11-17 NOTE — Telephone Encounter (Signed)
Requested medication (s) are due for refill today: Yes  Requested medication (s) are on the active medication list: Yes  Last refill:  09/12/22 #30, 0RF  Future visit scheduled: Yes  Notes to clinic:  Unable to refill per protocol, cannot delegate.      Requested Prescriptions  Pending Prescriptions Disp Refills   ondansetron (ZOFRAN) 4 MG tablet [Pharmacy Med Name: ONDANSETRON HCL 4 MG TABLET] 18 tablet 1    Sig: TAKE 1 TABLET BY MOUTH EVERY 8 HOURS AS NEEDED FOR NAUSEA AND VOMITING     Not Delegated - Gastroenterology: Antiemetics - ondansetron Failed - 11/17/2022 10:14 AM      Failed - This refill cannot be delegated      Failed - AST in normal range and within 360 days    AST  Date Value Ref Range Status  11/14/2022 35 (H) 10 - 30 U/L Final         Failed - ALT in normal range and within 360 days    ALT  Date Value Ref Range Status  11/14/2022 35 (H) 6 - 29 U/L Final         Failed - Valid encounter within last 6 months    Recent Outpatient Visits           2 years ago Benign essential HTN   [facility] Donita Brooks, MD   3 years ago Bronchitis with acute wheezing   [facility] Donita Brooks, MD   7 years ago Acute maxillary sinusitis, recurrence not specified   [facility] [place], Velna Hatchet, MD   8 years ago Morbid obesity   [facility] Donita Brooks, MD   8 years ago Acute cystitis without hematuria   [facility] [place], Velna Hatchet, MD       Future Appointments             In 12 months Pickard, Priscille Heidelberg, MD [facility]

## 2022-11-18 ENCOUNTER — Telehealth: Payer: BC Managed Care – PPO | Admitting: Physician Assistant

## 2022-11-18 DIAGNOSIS — J069 Acute upper respiratory infection, unspecified: Secondary | ICD-10-CM

## 2022-11-18 MED ORDER — BENZONATATE 100 MG PO CAPS
100.0000 mg | ORAL_CAPSULE | Freq: Three times a day (TID) | ORAL | 0 refills | Status: DC | PRN
Start: 2022-11-18 — End: 2023-01-18

## 2022-11-18 MED ORDER — FLUTICASONE PROPIONATE 50 MCG/ACT NA SUSP
2.0000 | Freq: Every day | NASAL | 6 refills | Status: AC
Start: 2022-11-18 — End: ?

## 2022-11-18 NOTE — Progress Notes (Signed)

## 2022-11-22 ENCOUNTER — Encounter: Payer: Self-pay | Admitting: Family Medicine

## 2022-11-23 ENCOUNTER — Other Ambulatory Visit: Payer: Self-pay | Admitting: Family Medicine

## 2022-11-23 DIAGNOSIS — I10 Essential (primary) hypertension: Secondary | ICD-10-CM

## 2022-11-23 MED ORDER — ONDANSETRON HCL 4 MG PO TABS: 4.0000 mg | ORAL_TABLET | Freq: Three times a day (TID) | ORAL | 0 refills | Status: DC | PRN

## 2022-11-23 MED ORDER — CIPROFLOXACIN HCL 500 MG PO TABS: 500.0000 mg | ORAL_TABLET | Freq: Two times a day (BID) | ORAL | 0 refills | Status: AC

## 2022-11-23 NOTE — Telephone Encounter (Signed)
Requested Prescriptions  Pending Prescriptions Disp Refills   metoprolol tartrate (LOPRESSOR) 25 MG tablet [Pharmacy Med Name: METOPROLOL TARTRATE 25 MG TAB] 180 tablet 1    Sig: TAKE 1 TABLET BY MOUTH TWICE A DAY     Cardiovascular:  Beta Blockers Failed - 11/23/2022  1:33 AM      Failed - Valid encounter within last 6 months    Recent Outpatient Visits           2 years ago Benign essential HTN   [facility] Donita Brooks, MD   3 years ago Bronchitis with acute wheezing   [facility] Donita Brooks, MD   7 years ago Acute maxillary sinusitis, recurrence not specified   [facility] [place], Velna Hatchet, MD   8 years ago Morbid obesity   [facility] Donita Brooks, MD   8 years ago Acute cystitis without hematuria   [facility] [place], Velna Hatchet, MD       Future Appointments             In 11 months Pickard, Priscille Heidelberg, MD [facility]            Passed - Last BP in normal range    BP Readings from Last 1 Encounters:  11/14/22 128/82         Passed - Last Heart Rate in normal range    Pulse Readings from Last 1 Encounters:  11/14/22 83

## 2022-11-28 ENCOUNTER — Encounter: Payer: Self-pay | Admitting: Family Medicine

## 2022-12-02 ENCOUNTER — Encounter: Payer: Self-pay | Admitting: Family Medicine

## 2022-12-06 ENCOUNTER — Encounter: Payer: BC Managed Care – PPO | Attending: Family Medicine | Admitting: Dietician

## 2022-12-06 VITALS — Wt 192.8 lb

## 2022-12-06 DIAGNOSIS — I10 Essential (primary) hypertension: Secondary | ICD-10-CM | POA: Diagnosis not present

## 2022-12-06 DIAGNOSIS — E66811 Obesity, class 1: Secondary | ICD-10-CM | POA: Diagnosis not present

## 2022-12-06 NOTE — Progress Notes (Signed)
Medical Nutrition Therapy: Visit start time: 1545  end time: 1630  Assessment:  Diagnosis: obesity, HTN, lupus Medical history changes: diagnosis of fatty liver disease; ongoing nausea and vomiting episodes Psychosocial issues/ stress concerns: history of depression; seeing new (to her) psychiatrist  Medications, supplement changes: reconciled list in medical record   Current weight: 192.8lbs Height: 5'4" BMI: 33.09  Progress and evaluation:  Patient reports frequent nausea and vomiting, often in am and sometimes during the night. No definitive cause has yet been identified; she will be changing GI specialists in hopes of making progress in finding cause.  Biopsy uncovered some fat deposits in liver, no other liver issues or gallstones.  Patient reports having some good days and some bad days with GI symptoms.  Weight has decreased about 9lbs overall since previous visit on 08/14/22.     Dietary Intake:  Usual eating pattern includes 2-3 meals and 0-1 snacks per day. Dining out frequency: 0-1 meals per week.  Breakfast: Greek yogurt unless nauseated Snack: none; occ sm bag popcorn Lunch: school lunch; lean cuisine Snack: none Supper: usu cooked at home -- whole wheat pasta/ grain + chicken/ [place] + veg small amounts lately Snack: none Beverages: water; when nauseated crystal light or apple juice  Physical activity: on the job activity; structured exercise 3-4 times a week  Intervention:   Nutrition Care Education:  Basic nutrition: reviewed appropriate nutrient balance; appropriate meal and snack schedule    Weight control: reviewed progress since previous visit; maintaining energy and protein intake to preserve leant body weight and avoid decreasing metabolism GI distress: eating small, frequent meals and snacks; avoiding very hot and very cold foods; limiting fat content and saturated fat in particular; avoiding beverage intake during meals other than a few small sips; keeping  suitable foods close and convenient Fatty liver: choosing foods low in saturated fat; eating at regular intervals; limiting processed starches and added sugars; including whole grains, veg, and fruits, beans and seeds (but avoiding very high fiber intake to avoid aggravating GI distress)  Other Intervention Notes: Current focus is on determining cause of GI symptoms Weight loss likely to continue with GI issues and semaglutide treatment.  Patient will determine whether additional MNT visits needed after seeing gastroenterologist.    Nutritional Diagnosis:  [place]-1.4 Altered GI function As related to nausea and vomiting of unknown cause, fatty liver diagnosis.  As evidenced by frequent GI upset leading to missed or very small meals. [place]-3.3 Overweight/obesity As related to history of excess calories, history of inadequate physical activity.  As evidenced by patient with current BMI of 33.   Education Materials given:  Managing nausea handout Engineer, water. [employer].) Visit summary with goals/ instructions   Learner/ who was taught:  Patient   Level of understanding: Verbalizes/ demonstrates competency  Demonstrated degree of understanding via:   Teach back Learning barriers: None  Willingness to learn/ readiness for change: Eager, change in progress   Monitoring and Evaluation:  Dietary intake, exercise, GI symptoms, and body weight      follow up: prn

## 2022-12-19 ENCOUNTER — Other Ambulatory Visit: Payer: Self-pay | Admitting: Family Medicine

## 2022-12-19 DIAGNOSIS — K760 Fatty (change of) liver, not elsewhere classified: Secondary | ICD-10-CM

## 2022-12-29 ENCOUNTER — Telehealth: Payer: BC Managed Care – PPO | Admitting: Physician Assistant

## 2022-12-29 DIAGNOSIS — R3989 Other symptoms and signs involving the genitourinary system: Secondary | ICD-10-CM | POA: Diagnosis not present

## 2022-12-29 MED ORDER — NITROFURANTOIN MONOHYD MACRO 100 MG PO CAPS
100.0000 mg | ORAL_CAPSULE | Freq: Two times a day (BID) | ORAL | 0 refills | Status: DC
Start: 2022-12-29 — End: 2023-01-18

## 2022-12-29 NOTE — Progress Notes (Signed)
E-Visit for Urinary Problems  We are sorry that you are not feeling well.  Here is how we plan to help!  Based on what you shared with me it looks like you most likely have a simple urinary tract infection.  A UTI (Urinary Tract Infection) is a bacterial infection of the bladder.  Most cases of urinary tract infections are simple to treat but a key part of your care is to encourage you to drink plenty of fluids and watch your symptoms carefully.  I have prescribed MacroBid 100 mg twice a day for 5 days.  Your symptoms should gradually improve. Call us if the burning in your urine worsens, you develop worsening fever, back pain or pelvic pain or if your symptoms do not resolve after completing the antibiotic.  Urinary tract infections can be prevented by drinking plenty of water to keep your body hydrated.  Also be sure when you wipe, wipe from front to back and don't hold it in!  If possible, empty your bladder every 4 hours.  HOME CARE Drink plenty of fluids Compete the full course of the antibiotics even if the symptoms resolve Remember, when you need to go.go. Holding in your urine can increase the likelihood of getting a UTI! GET HELP RIGHT AWAY IF: You cannot urinate You get a high fever Worsening back pain occurs You see blood in your urine You feel sick to your stomach or throw up You feel like you are going to pass out  MAKE SURE YOU  Understand these instructions. Will watch your condition. Will get help right away if you are not doing well or get worse.   Thank you for choosing an e-visit.  Your e-visit answers were reviewed by a board certified advanced clinical practitioner to complete your personal care plan. Depending upon the condition, your plan could have included both over the counter or prescription medications.  Please review your pharmacy choice. Make sure the pharmacy is open so you can pick up prescription now. If there is a problem, you may contact your  provider through MyChart messaging and have the prescription routed to another pharmacy.  Your safety is important to us. If you have drug allergies check your prescription carefully.   For the next 24 hours you can use MyChart to ask questions about today's visit, request a non-urgent call back, or ask for a work or school excuse. You will get an email in the next two days asking about your experience. I hope that your e-visit has been valuable and will speed your recovery.  I have spent 5 minutes in review of e-visit questionnaire, review and updating patient chart, medical decision making and response to patient.   Yavier Snider M Heberto [place], PA-C  

## 2023-01-16 ENCOUNTER — Encounter: Payer: Self-pay | Admitting: Family Medicine

## 2023-01-18 ENCOUNTER — Telehealth: Payer: BC Managed Care – PPO | Admitting: Physician Assistant

## 2023-01-18 DIAGNOSIS — J208 Acute bronchitis due to other specified organisms: Secondary | ICD-10-CM | POA: Diagnosis not present

## 2023-01-18 MED ORDER — BENZONATATE 100 MG PO CAPS
100.0000 mg | ORAL_CAPSULE | Freq: Three times a day (TID) | ORAL | 0 refills | Status: DC | PRN
Start: 2023-01-18 — End: 2023-10-30

## 2023-01-18 MED ORDER — ALBUTEROL SULFATE HFA 108 (90 BASE) MCG/ACT IN AERS
2.0000 | INHALATION_SPRAY | Freq: Four times a day (QID) | RESPIRATORY_TRACT | 0 refills | Status: AC | PRN
Start: 2023-01-18 — End: ?

## 2023-01-18 NOTE — Progress Notes (Signed)
Virtual Visit Consent   Kendra Jacobson, you are scheduled for a virtual visit with a [facility] provider today. Just as with appointments in the office, your consent must be obtained to participate. Your consent will be active for this visit and any virtual visit you may have with one of our providers in the next 365 days. If you have a MyChart account, a copy of this consent can be sent to you electronically.  As this is a virtual visit, video technology does not allow for your provider to perform a traditional examination. This may limit your provider's ability to fully assess your condition. If your provider identifies any concerns that need to be evaluated in person or the need to arrange testing (such as labs, EKG, etc.), we will make arrangements to do so. Although advances in technology are sophisticated, we cannot ensure that it will always work on either your end or our end. If the connection with a video visit is poor, the visit may have to be switched to a telephone visit. With either a video or telephone visit, we are not always able to ensure that we have a secure connection.  By engaging in this virtual visit, you consent to the provision of healthcare and authorize for your insurance to be billed (if applicable) for the services provided during this visit. Depending on your insurance coverage, you may receive a charge related to this service.  I need to obtain your verbal consent now. Are you willing to proceed with your visit today? Kendra Jacobson has provided verbal consent on 01/18/2023 for a virtual visit (video or telephone). Piedad Climes, [place]  Date: 01/18/2023 6:00 PM  Virtual Visit via Video Note   I, Piedad Climes, connected with  Kendra Jacobson  ([id number], 04-05-1980) on 01/18/23 at  5:45 PM EST by a video-enabled telemedicine application and verified that I am speaking with the correct person using two identifiers.  Location: Patient:  Virtual Visit Location Patient: Home Provider: Virtual Visit Location Provider: Home Office   I discussed the limitations of evaluation and management by telemedicine and the availability of in person appointments. The patient expressed understanding and agreed to proceed.    History of Present Illness: Kendra Jacobson is a 42 y.o. who identifies as a female who was assigned female at birth, and is being seen today for cough that has been present for the past week. Initially presented as more of a "tickle in the throat" so she felt was possibly allergy-related. As such she restarted allergy medication and Flonase. Notes cough has persistent and progressed to a deeper cough that is sometimes productive of clear phlegm, but not often. Notes some chest tightness and harder to take a deep breath without a coughing spell. Denies chest pain. Denies recent travel or known sick contact. Denies fever, chills, aches. History of GERD but currently on PPI and denies breakthrough symptoms.   HPI: HPI  Problems:  Patient Active Problem List   Diagnosis Date Noted   Allergic rhinitis 07/27/2022   Vasomotor rhinitis 07/27/2022   Obesity (BMI 30.0-34.9) 05/19/2022   Benign essential HTN 11/28/2021   Morbid obesity (HCC) 11/28/2021   Snoring 09/07/2020   S/P cesarean section 10/03/2014   Depression 04/23/2013   Lupus 04/23/2013    Allergies:  Allergies  Allergen Reactions   Erythromycin Other (See Comments)    Upset stomach    Medications:  Current Outpatient Medications:    albuterol (VENTOLIN HFA) 108 (90 Base)  MCG/ACT inhaler, Inhale 2 puffs into the lungs every 6 (six) hours as needed for wheezing or shortness of breath., Disp: 8 g, Rfl: 0   benzonatate (TESSALON) 100 MG capsule, Take 1 capsule (100 mg total) by mouth 3 (three) times daily as needed for cough., Disp: 30 capsule, Rfl: 0   azaTHIOprine (IMURAN) 50 MG tablet, Take 100 mg by mouth 2 times daily at 12 noon and 4 pm., Disp: ,  Rfl:    fluticasone (FLONASE) 50 MCG/ACT nasal spray, Place 2 sprays into both nostrils daily., Disp: 16 g, Rfl: 6   hydrochlorothiazide (HYDRODIURIL) 25 MG tablet, TAKE 1 TABLET BY MOUTH EVERY DAY, Disp: 90 tablet, Rfl: 1   hydroxychloroquine (PLAQUENIL) 200 MG tablet, Take 400 mg by mouth every evening. , Disp: , Rfl:    JUNEL 1/20 1-20 MG-MCG tablet, , Disp: , Rfl:    metoprolol tartrate (LOPRESSOR) 25 MG tablet, TAKE 1 TABLET BY MOUTH TWICE A DAY, Disp: 180 tablet, Rfl: 1   norethindrone-ethinyl estradiol-FE (LOESTRIN FE) 1-20 MG-MCG tablet, Take 1 tablet by mouth daily., Disp: , Rfl:    ondansetron (ZOFRAN) 4 MG tablet, Take 1 tablet (4 mg total) by mouth every 8 (eight) hours as needed for nausea or vomiting., Disp: 30 tablet, Rfl: 0   predniSONE (DELTASONE) 5 MG tablet, Take 5 mg by mouth daily with breakfast., Disp: , Rfl:    TRINTELLIX 10 MG TABS tablet, TAKE 1 TABLET BY MOUTH EVERY DAY, Disp: 30 tablet, Rfl: 3  Observations/Objective: Patient is well-developed, well-nourished in no acute distress.  Resting comfortably at home.  Head is normocephalic, atraumatic.  No labored breathing. Speech is clear and coherent with logical content.  Patient is alert and oriented at baseline.   Assessment and Plan: 1. Viral bronchitis (Primary) - benzonatate (TESSALON) 100 MG capsule; Take 1 capsule (100 mg total) by mouth 3 (three) times daily as needed for cough.  Dispense: 30 capsule; Refill: 0 - albuterol (VENTOLIN HFA) 108 (90 Base) MCG/ACT inhaler; Inhale 2 puffs into the lungs every 6 (six) hours as needed for wheezing or shortness of breath.  Dispense: 8 g; Refill: 0  Supportive measures and OTC medications reviewed. Continue Flonase and Mucinex. Start Albuterol and Tessalon as directed. Follow-up if not improving over next 72 hours or if any worsening symptoms, or development of fever.   Follow Up Instructions: I discussed the assessment and treatment plan with the patient. The  patient was provided an opportunity to ask questions and all were answered. The patient agreed with the plan and demonstrated an understanding of the instructions.  A copy of instructions were sent to the patient via MyChart unless otherwise noted below.   The patient was advised to call back or seek an in-person evaluation if the symptoms worsen or if the condition fails to improve as anticipated.    Piedad Climes, PA-C

## 2023-01-18 NOTE — Patient Instructions (Signed)
Kendra Jacobson, thank you for joining Piedad Climes, PA-C for today's virtual visit.  While this provider is not your primary care provider (PCP), if your PCP is located in our provider database this encounter information will be shared with them immediately following your visit.   A [facility] MyChart account gives you access to today's visit and all your visits, tests, and labs performed at [facility] " click here if you don't have a [facility] MyChart account or go to [email or web address]  Consent: (Patient) Kendra Jacobson provided verbal consent for this virtual visit at the beginning of the encounter.  Current Medications:  Current Outpatient Medications:    albuterol (VENTOLIN HFA) 108 (90 Base) MCG/ACT inhaler, Inhale 2 puffs into the lungs every 6 (six) hours as needed for wheezing or shortness of breath., Disp: 8 g, Rfl: 0   benzonatate (TESSALON) 100 MG capsule, Take 1 capsule (100 mg total) by mouth 3 (three) times daily as needed for cough., Disp: 30 capsule, Rfl: 0   azaTHIOprine (IMURAN) 50 MG tablet, Take 100 mg by mouth 2 times daily at 12 noon and 4 pm., Disp: , Rfl:    fluticasone (FLONASE) 50 MCG/ACT nasal spray, Place 2 sprays into both nostrils daily., Disp: 16 g, Rfl: 6   hydrochlorothiazide (HYDRODIURIL) 25 MG tablet, TAKE 1 TABLET BY MOUTH EVERY DAY, Disp: 90 tablet, Rfl: 1   hydroxychloroquine (PLAQUENIL) 200 MG tablet, Take 400 mg by mouth every evening. , Disp: , Rfl:    JUNEL 1/20 1-20 MG-MCG tablet, , Disp: , Rfl:    metoprolol tartrate (LOPRESSOR) 25 MG tablet, TAKE 1 TABLET BY MOUTH TWICE A DAY, Disp: 180 tablet, Rfl: 1   norethindrone-ethinyl estradiol-FE (LOESTRIN FE) 1-20 MG-MCG tablet, Take 1 tablet by mouth daily., Disp: , Rfl:    ondansetron (ZOFRAN) 4 MG tablet, Take 1 tablet (4 mg total) by mouth every 8 (eight) hours as needed for nausea or vomiting., Disp: 30 tablet, Rfl: 0   predniSONE (DELTASONE) 5 MG tablet,  Take 5 mg by mouth daily with breakfast., Disp: , Rfl:    TRINTELLIX 10 MG TABS tablet, TAKE 1 TABLET BY MOUTH EVERY DAY, Disp: 30 tablet, Rfl: 3   Medications ordered in this encounter:  Meds ordered this encounter  Medications   benzonatate (TESSALON) 100 MG capsule    Sig: Take 1 capsule (100 mg total) by mouth 3 (three) times daily as needed for cough.    Dispense:  30 capsule    Refill:  0    Supervising Provider:   Merrilee Jansky [[id number]]   albuterol (VENTOLIN HFA) 108 (90 Base) MCG/ACT inhaler    Sig: Inhale 2 puffs into the lungs every 6 (six) hours as needed for wheezing or shortness of breath.    Dispense:  8 g    Refill:  0    Supervising Provider:   Merrilee Jansky [[id number]]     *If you need refills on other medications prior to your next appointment, please contact your pharmacy*  Follow-Up: Call back or seek an in-person evaluation if the symptoms worsen or if the condition fails to improve as anticipated.  [facility] [phone]  Other Instructions E-Visit for Upper Respiratory Infection   We are sorry you are not feeling well.  Here is how we plan to help!  Based on what you have shared with me, it looks like you may have a viral upper respiratory infection.  Upper respiratory infections are  caused by a large number of viruses; however, rhinovirus is the most common cause.   Symptoms vary from person to person, with common symptoms including sore throat, cough, fatigue or lack of energy and feeling of general discomfort.  A low-grade fever of up to 100.4 may present, but is often uncommon.  Symptoms vary however, and are closely related to a person's age or underlying illnesses.  The most common symptoms associated with an upper respiratory infection are nasal discharge or congestion, cough, sneezing, headache and pressure in the ears and face.  These symptoms usually persist for about 3 to 10 days, but can last up to 2 weeks.  It is important to  know that upper respiratory infections do not cause serious illness or complications in most cases.    Upper respiratory infections can be transmitted from person to person, with the most common method of transmission being a person's hands.  The virus is able to live on the skin and can infect other persons for up to 2 hours after direct contact.  Also, these can be transmitted when someone coughs or sneezes; thus, it is important to cover the mouth to reduce this risk.  To keep the spread of the illness at bay, good hand hygiene is very important.  This is an infection that is most likely caused by a virus. There are no specific treatments other than to help you with the symptoms until the infection runs its course.  We are sorry you are not feeling well.  Here is how we plan to help!   For nasal congestion, you may use an oral decongestants such as Mucinex D or if you have glaucoma or high blood pressure use plain Mucinex.  Saline nasal spray or nasal drops can help and can safely be used as often as needed for congestion.  Continue your Flonase and allergy medications.  If you do not have a history of heart disease, hypertension, diabetes or thyroid disease, prostate/bladder issues or glaucoma, you may also use Sudafed to treat nasal congestion.  It is highly recommended that you consult with a pharmacist or your primary care physician to ensure this medication is safe for you to take.     For cough I have prescribed for you A prescription cough medication called Tessalon Perles 100 mg. You may take 1-2 capsules every 8 hours as needed for cough  I have also prescribed an albuterol inhaler to use as directed.  If you have a sore or scratchy throat, use a saltwater gargle-  to  teaspoon of salt dissolved in a 4-ounce to 8-ounce glass of warm water.  Gargle the solution for approximately 15-30 seconds and then spit.  It is important not to swallow the solution.  You can also use throat  lozenges/cough drops and Chloraseptic spray to help with throat pain or discomfort.  Warm or cold liquids can also be helpful in relieving throat pain.  For headache, pain or general discomfort, you can use Ibuprofen or Tylenol as directed.   Some authorities believe that zinc sprays or the use of Echinacea may shorten the course of your symptoms.   HOME CARE Only take medications as instructed by your medical team. Be sure to drink plenty of fluids. Water is fine as well as fruit juices, sodas and electrolyte beverages. You may want to stay away from caffeine or alcohol. If you are nauseated, try taking small sips of liquids. How do you know if you are getting enough fluid? Your  urine should be a pale yellow or almost colorless. Get rest. Taking a steamy shower or using a humidifier may help nasal congestion and ease sore throat pain. You can place a towel over your head and breathe in the steam from hot water coming from a faucet. Using a saline nasal spray works much the same way. Cough drops, hard candies and sore throat lozenges may ease your cough. Avoid close contacts especially the very young and the elderly Cover your mouth if you cough or sneeze Always remember to wash your hands.   GET HELP RIGHT AWAY IF: You develop worsening fever. If your symptoms do not improve within 10 days You develop yellow or green discharge from your nose over 3 days. You have coughing fits You develop a severe head ache or visual changes. You develop shortness of breath, difficulty breathing or start having chest pain Your symptoms persist after you have completed your treatment plan  MAKE SURE YOU  Understand these instructions. Will watch your condition. Will get help right away if you are not doing well or get worse.       If you have been instructed to have an in-person evaluation today at a local Urgent Care facility, please use the link below. It will take you to a list of all of our  available [facility], including address, phone number and hours of operation. Please do not delay care.  [facility]  If you or a family member do not have a primary care provider, use the link below to schedule a visit and establish care. When you choose a [facility] primary care physician or advanced practice provider, you gain a long-term partner in health. Find a Primary Care Provider  Learn more about [facility]'s in-office and virtual care options:  - Get Care Now

## 2023-01-21 ENCOUNTER — Encounter: Payer: Self-pay | Admitting: Family Medicine

## 2023-01-23 ENCOUNTER — Other Ambulatory Visit: Payer: BC Managed Care – PPO

## 2023-01-23 ENCOUNTER — Other Ambulatory Visit: Payer: Self-pay | Admitting: Family Medicine

## 2023-01-23 DIAGNOSIS — R8290 Unspecified abnormal findings in urine: Secondary | ICD-10-CM

## 2023-01-23 LAB — URINALYSIS, ROUTINE W REFLEX MICROSCOPIC
Glucose, UA: NEGATIVE
Hgb urine dipstick: NEGATIVE
Nitrite: POSITIVE — AB
Specific Gravity, Urine: 1.029 (ref 1.001–1.035)
pH: 6 (ref 5.0–8.0)

## 2023-01-23 LAB — MICROSCOPIC MESSAGE

## 2023-01-23 MED ORDER — CEPHALEXIN 500 MG PO CAPS: 500.0000 mg | ORAL_CAPSULE | Freq: Three times a day (TID) | ORAL | 0 refills | Status: DC

## 2023-01-25 LAB — URINE CULTURE
MICRO NUMBER:: 15860715
SPECIMEN QUALITY:: ADEQUATE

## 2023-01-26 ENCOUNTER — Encounter: Payer: Self-pay | Admitting: Family Medicine

## 2023-01-26 ENCOUNTER — Ambulatory Visit: Payer: BC Managed Care – PPO | Admitting: Family Medicine

## 2023-01-26 VITALS — BP 122/80 | HR 80 | Temp 98.3°F | Ht 64.0 in | Wt 178.5 lb

## 2023-01-26 DIAGNOSIS — N390 Urinary tract infection, site not specified: Secondary | ICD-10-CM

## 2023-01-26 NOTE — Progress Notes (Signed)
Subjective:    Patient ID: Kendra Jacobson, female    DOB: 06-02-1980, 42 y.o.   MRN: [id number]  HPI Wt Readings from Last 3 Encounters:  01/26/23 178 lb 8 oz (81 kg)  12/06/22 192 lb 12.8 oz (87.5 kg)  11/14/22 195 lb 6.4 oz (88.6 kg)   Patient is concerned regarding recurrent urinary tract infections.  She had a urinary tract infection in October.  Urine culture confirmed E. coli that was pansensitive.  She recently had another urinary tract infection.  She is currently on Keflex.  Urine culture confirmed E. coli.  She was treated a third time in between with a virtual visit provider.  Urinary tract infections tend to occur after sexual activity.  However, sexual activity is occurring approximately 5 times a week.  Patient is urinating immediately after sex.  She is taking as many precautions as possible. Past Medical History:  Diagnosis Date   Complication of anesthesia    hallucinated with pain medicine after surgery   Depression    Hypertension    Lupus    Dr.[facility]   Past Surgical History:  Procedure Laterality Date   CESAREAN SECTION  04/2009   CESAREAN SECTION N/A 10/03/2014   Procedure: CESAREAN SECTION;  Surgeon: Marcelle Overlie, MD;  Location: [facility];  Service: Obstetrics;  Laterality: N/A;   MUSCLE BIOPSY Left 09/18/2012   Procedure: MUSCLE BIOPSY;  Surgeon: Lodema Pilot, DO;  Location: [facility];  Service: General;  Laterality: Left;   TONSILLECTOMY  1999   WISDOM TOOTH EXTRACTION  2000   x4   Current Outpatient Medications on File Prior to Visit  Medication Sig Dispense Refill   cephALEXin (KEFLEX) 500 MG capsule Take 1 capsule (500 mg total) by mouth 3 (three) times daily. 21 capsule 0   albuterol (VENTOLIN HFA) 108 (90 Base) MCG/ACT inhaler Inhale 2 puffs into the lungs every 6 (six) hours as needed for wheezing or shortness of breath. 8 g 0   azaTHIOprine (IMURAN) 50 MG tablet Take 100 mg by mouth 2 times daily at 12 noon and 4 pm.     benzonatate  (TESSALON) 100 MG capsule Take 1 capsule (100 mg total) by mouth 3 (three) times daily as needed for cough. 30 capsule 0   fluticasone (FLONASE) 50 MCG/ACT nasal spray Place 2 sprays into both nostrils daily. 16 g 6   hydrochlorothiazide (HYDRODIURIL) 25 MG tablet TAKE 1 TABLET BY MOUTH EVERY DAY 90 tablet 1   hydroxychloroquine (PLAQUENIL) 200 MG tablet Take 400 mg by mouth every evening.      JUNEL 1/20 1-20 MG-MCG tablet      metoprolol tartrate (LOPRESSOR) 25 MG tablet TAKE 1 TABLET BY MOUTH TWICE A DAY 180 tablet 1   norethindrone-ethinyl estradiol-FE (LOESTRIN FE) 1-20 MG-MCG tablet Take 1 tablet by mouth daily.     ondansetron (ZOFRAN) 4 MG tablet Take 1 tablet (4 mg total) by mouth every 8 (eight) hours as needed for nausea or vomiting. 30 tablet 0   predniSONE (DELTASONE) 5 MG tablet Take 5 mg by mouth daily with breakfast.     TRINTELLIX 10 MG TABS tablet TAKE 1 TABLET BY MOUTH EVERY DAY 30 tablet 3   No current facility-administered medications on file prior to visit.   Allergies  Allergen Reactions   Erythromycin Other (See Comments)    Upset stomach    Social History   Socioeconomic History   Marital status: Married    Spouse name: Not on file  Number of children: Not on file   Years of education: Not on file   Highest education level: Bachelor's degree (e.g., BA, AB, BS)  Occupational History   Not on file  Tobacco Use   Smoking status: Never   Smokeless tobacco: Never  Vaping Use   Vaping status: Never Used  Substance and Sexual Activity   Alcohol use: No   Drug use: No   Sexual activity: Yes  Other Topics Concern   Not on file  Social History Narrative   Are you right handed or left handed? Right   Are you currently employed ? 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   Do you live at home alone?no   Who lives with you? Husband and 2 kids   What type of home do you live in: 1 story or 2 story? one    Caff.1 soda a day   Social Drivers of  Corporate investment banker Strain: Low Risk  (07/27/2022)   Overall Financial Resource Strain (CARDIA)    Difficulty of Paying Living Expenses: Not hard at all  Food Insecurity: No Food Insecurity (07/27/2022)   Hunger Vital Sign    Worried About Running Out of Food in the Last Year: Never true    Ran Out of Food in the Last Year: Never true  Transportation Needs: No Transportation Needs (07/27/2022)   PRAPARE - Administrator, Civil Service (Medical): No    Lack of Transportation (Non-Medical): No  Physical Activity: Unknown (07/27/2022)   Exercise Vital Sign    Days of Exercise per Week: 0 days    Minutes of Exercise per Session: Not on file  Stress: No Stress Concern Present (07/27/2022)   [employer] of Occupational Health - Occupational Stress Questionnaire    Feeling of Stress : Only a little  Social Connections: Socially Integrated (07/27/2022)   Social Connection and Isolation Panel [NHANES]    Frequency of Communication with Friends and Family: More than three times a week    Frequency of Social Gatherings with Friends and Family: Once a week    Attends Religious Services: More than 4 times per year    Active Member of [employer] or Organizations: Yes    Attends Engineer, structural: More than 4 times per year    Marital Status: Married  Catering manager Violence: Not on file     Review of Systems  All other systems reviewed and are negative.      Objective:   Physical Exam Constitutional:      General: She is not in acute distress.    Appearance: Normal appearance. She is obese. She is not ill-appearing or toxic-appearing.  HENT:     Right Ear: Tympanic membrane and ear canal normal.     Left Ear: Tympanic membrane and ear canal normal.  Eyes:     Extraocular Movements: Extraocular movements intact.     Conjunctiva/sclera: Conjunctivae normal.     Pupils: Pupils are equal, round, and reactive to light.  Cardiovascular:     Rate and Rhythm:  Normal rate and regular rhythm.     Heart sounds: Normal heart sounds. No murmur heard.    No gallop.  Pulmonary:     Effort: Pulmonary effort is normal. No respiratory distress.     Breath sounds: Normal breath sounds. No stridor. No wheezing, rhonchi or rales.  Abdominal:     General: Bowel sounds are normal. There is no distension.  Palpations: Abdomen is soft.     Tenderness: There is no abdominal tenderness. There is no guarding or rebound.  Musculoskeletal:        General: No swelling or deformity.     Cervical back: Normal range of motion and neck supple. No rigidity or tenderness.     Right lower leg: No edema.     Left lower leg: No edema.  Lymphadenopathy:     Cervical: No cervical adenopathy.  Skin:    Coloration: Skin is not jaundiced.     Findings: No bruising, erythema or lesion.  Neurological:     General: No focal deficit present.     Mental Status: She is alert and oriented to person, place, and time. Mental status is at baseline.     Cranial Nerves: No cranial nerve deficit.     Motor: No weakness.     Gait: Gait normal.     Deep Tendon Reflexes: Reflexes normal.  Psychiatric:        Mood and Affect: Mood normal.        Behavior: Behavior normal.        Thought Content: Thought content normal.        Judgment: Judgment normal.           Assessment & Plan:  Recurrent UTI Patient currently is averaging about 6 urinary tract infections a year.  I believe that this is due partly to increase sexual activity.  I believe that this is partly due to her immunosuppression.  At the present time I have suggested that we treat the infection is only with her.  I would not recommend prophylactic dose of antibiotics after sex.  Reason for this is that the patient will be taking an antibiotic 5 times a week.  I believe this would lead to antibiotic resistance.  If the frequency increases, I will consult urology for cystoscopy to rule out anatomic abnormalities in the  bladder.  We discussed strategies to help prevent urinary tract infections after sexual activity

## 2023-02-02 ENCOUNTER — Other Ambulatory Visit: Payer: Self-pay | Admitting: Family Medicine

## 2023-02-02 ENCOUNTER — Encounter: Payer: Self-pay | Admitting: Family Medicine

## 2023-02-02 ENCOUNTER — Ambulatory Visit: Payer: BC Managed Care – PPO | Admitting: Family Medicine

## 2023-02-02 MED ORDER — DOXYCYCLINE HYCLATE 100 MG PO TABS: 100.0000 mg | ORAL_TABLET | Freq: Two times a day (BID) | ORAL | 0 refills | Status: DC

## 2023-02-02 MED ORDER — HYDROCODONE BIT-HOMATROP MBR 5-1.5 MG/5ML PO SOLN: 5.0000 mL | Freq: Three times a day (TID) | ORAL | 0 refills | Status: DC | PRN

## 2023-02-05 ENCOUNTER — Ambulatory Visit: Payer: BC Managed Care – PPO | Admitting: Family Medicine

## 2023-03-02 ENCOUNTER — Encounter: Payer: Self-pay | Admitting: Family Medicine

## 2023-03-12 ENCOUNTER — Encounter: Payer: Self-pay | Admitting: Family Medicine

## 2023-03-21 ENCOUNTER — Encounter: Payer: Self-pay | Admitting: Family Medicine

## 2023-03-22 ENCOUNTER — Other Ambulatory Visit: Payer: Self-pay | Admitting: Family Medicine

## 2023-03-22 MED ORDER — CEPHALEXIN 500 MG PO CAPS: 500.0000 mg | ORAL_CAPSULE | Freq: Three times a day (TID) | ORAL | 0 refills | Status: DC

## 2023-03-22 NOTE — Progress Notes (Signed)
Keflex

## 2023-04-21 ENCOUNTER — Telehealth: Payer: Self-pay | Admitting: Nurse Practitioner

## 2023-04-21 DIAGNOSIS — R399 Unspecified symptoms and signs involving the genitourinary system: Secondary | ICD-10-CM | POA: Diagnosis not present

## 2023-04-21 MED ORDER — NITROFURANTOIN MONOHYD MACRO 100 MG PO CAPS: 100.0000 mg | ORAL_CAPSULE | Freq: Two times a day (BID) | ORAL | 0 refills | Status: AC

## 2023-04-21 NOTE — Progress Notes (Signed)
 I have spent 5 minutes in review of e-visit questionnaire, review and updating patient chart, medical decision making and response to patient.   Claiborne Rigg, NP

## 2023-04-21 NOTE — Progress Notes (Signed)

## 2023-04-26 ENCOUNTER — Other Ambulatory Visit: Payer: Self-pay

## 2023-04-26 ENCOUNTER — Telehealth: Payer: Self-pay | Admitting: Family Medicine

## 2023-04-26 DIAGNOSIS — I10 Essential (primary) hypertension: Secondary | ICD-10-CM

## 2023-04-26 MED ORDER — HYDROCHLOROTHIAZIDE 25 MG PO TABS: ORAL_TABLET | ORAL | 1 refills | Status: DC

## 2023-04-26 NOTE — Telephone Encounter (Signed)
 Prescription Request  04/26/2023  LOV: 01/26/2023  What is the name of the medication or equipment?   hydrochlorothiazide (HYDRODIURIL) 25 MG tablet   Have you contacted your pharmacy to request a refill? Yes   Which pharmacy would you like this sent to?  [id number] - [place], [place] - [address] [place] [address] Phone: [phone] Fax: [phone]    Patient notified that their request is being sent to the clinical staff for review and that they should receive a response within 2 business days.   Please advise pharmacist.

## 2023-04-29 ENCOUNTER — Telehealth: Admitting: Family

## 2023-04-29 ENCOUNTER — Encounter: Payer: Self-pay | Admitting: Family Medicine

## 2023-04-29 DIAGNOSIS — N390 Urinary tract infection, site not specified: Secondary | ICD-10-CM

## 2023-04-29 NOTE — Progress Notes (Signed)
  Because of your recurrent UTI symptoms, I feel your condition warrants further evaluation and I recommend that you be seen in a face-to-face visit.   NOTE: There will be NO CHARGE for this E-Visit   If you are having a true medical emergency, please call 911.     For an urgent face to face visit, [facility] has multiple urgent care centers for your convenience.  Click the link below for the full list of locations and hours, walk-in wait times, appointment scheduling options and driving directions:  Urgent Care - [place], [place], [place], [place], [place], [place]  [place]     Your MyChart E-visit questionnaire answers were reviewed by a board certified advanced clinical practitioner to complete your personal care plan based on your specific symptoms.    Thank you for using e-Visits.

## 2023-05-01 ENCOUNTER — Other Ambulatory Visit: Payer: Self-pay | Admitting: Family Medicine

## 2023-05-01 ENCOUNTER — Other Ambulatory Visit: Payer: Self-pay

## 2023-05-01 DIAGNOSIS — N390 Urinary tract infection, site not specified: Secondary | ICD-10-CM

## 2023-05-01 MED ORDER — CEPHALEXIN 500 MG PO CAPS: 500.0000 mg | ORAL_CAPSULE | Freq: Three times a day (TID) | ORAL | 0 refills | Status: DC

## 2023-05-21 ENCOUNTER — Other Ambulatory Visit: Payer: Self-pay

## 2023-05-21 DIAGNOSIS — I10 Essential (primary) hypertension: Secondary | ICD-10-CM

## 2023-05-21 NOTE — Telephone Encounter (Signed)
 Prescription Request  05/21/2023  LOV: 02/05/23  What is the name of the medication or equipment? metoprolol tartrate (LOPRESSOR) 25 MG tablet [[id number]]   Have you contacted your pharmacy to request a refill? Yes   Which pharmacy would you like this sent to?  [id number] - [place],  - [address] [place] [address] Phone: [phone] Fax: [phone]    Patient notified that their request is being sent to the clinical staff for review and that they should receive a response within 2 business days.   Please advise at [facility] [phone]

## 2023-05-22 MED ORDER — METOPROLOL TARTRATE 25 MG PO TABS: 25.0000 mg | ORAL_TABLET | Freq: Two times a day (BID) | ORAL | 1 refills | Status: AC

## 2023-05-22 NOTE — Telephone Encounter (Signed)
 Requested Prescriptions  Pending Prescriptions Disp Refills   metoprolol tartrate (LOPRESSOR) 25 MG tablet 180 tablet 1    Sig: Take 1 tablet (25 mg total) by mouth 2 (two) times daily.     Cardiovascular:  Beta Blockers Passed - 05/22/2023 11:21 AM      Passed - Last BP in normal range    BP Readings from Last 1 Encounters:  01/26/23 122/80         Passed - Last Heart Rate in normal range    Pulse Readings from Last 1 Encounters:  01/26/23 80         Passed - Valid encounter within last 6 months    Recent Outpatient Visits           3 months ago Recurrent UTI   [place] [facility] Cheril Cork, Cisco Crest, MD   6 months ago NASH (nonalcoholic steatohepatitis)   [place] [facility] Austine Lefort, MD   8 months ago Nausea and vomiting, unspecified vomiting type   [place] [facility] Austine Lefort, MD   9 months ago Elevated liver function tests   [place] [facility] Austine Lefort, MD   1 year ago Left otitis media, unspecified otitis media type   [place] [facility] Pickard, Cisco Crest, MD       Future Appointments             In 5 months Pickard, Cisco Crest, MD [facility]

## 2023-06-29 ENCOUNTER — Other Ambulatory Visit: Payer: Self-pay | Admitting: Family Medicine

## 2023-06-29 ENCOUNTER — Encounter: Payer: Self-pay | Admitting: Family Medicine

## 2023-06-29 MED ORDER — CIPROFLOXACIN HCL 500 MG PO TABS: 500.0000 mg | ORAL_TABLET | Freq: Two times a day (BID) | ORAL | 0 refills | Status: AC

## 2023-07-06 DIAGNOSIS — E559 Vitamin D deficiency, unspecified: Secondary | ICD-10-CM | POA: Insufficient documentation

## 2023-07-06 DIAGNOSIS — Z7952 Long term (current) use of systemic steroids: Secondary | ICD-10-CM | POA: Insufficient documentation

## 2023-10-08 ENCOUNTER — Other Ambulatory Visit: Payer: Self-pay | Admitting: Family Medicine

## 2023-10-08 DIAGNOSIS — I10 Essential (primary) hypertension: Secondary | ICD-10-CM

## 2023-10-30 ENCOUNTER — Telehealth: Admitting: Physician Assistant

## 2023-10-30 DIAGNOSIS — D849 Immunodeficiency, unspecified: Secondary | ICD-10-CM

## 2023-10-30 DIAGNOSIS — J0110 Acute frontal sinusitis, unspecified: Secondary | ICD-10-CM

## 2023-10-30 MED ORDER — AMOXICILLIN-POT CLAVULANATE 875-125 MG PO TABS: 1.0000 | ORAL_TABLET | Freq: Two times a day (BID) | ORAL | 0 refills | Status: DC

## 2023-10-30 NOTE — Progress Notes (Signed)

## 2023-10-30 NOTE — Progress Notes (Signed)
 I have spent 5 minutes in review of e-visit questionnaire, review and updating patient chart, medical decision making and response to patient.   Elsie Velma Lunger, PA-C

## 2023-11-15 ENCOUNTER — Ambulatory Visit (INDEPENDENT_AMBULATORY_CARE_PROVIDER_SITE_OTHER): Payer: BC Managed Care – PPO | Admitting: Family Medicine

## 2023-11-15 VITALS — BP 138/76 | HR 75 | Temp 97.8°F | Ht 64.0 in | Wt 149.0 lb

## 2023-11-15 DIAGNOSIS — Z23 Encounter for immunization: Secondary | ICD-10-CM | POA: Diagnosis not present

## 2023-11-15 DIAGNOSIS — Z0001 Encounter for general adult medical examination with abnormal findings: Secondary | ICD-10-CM

## 2023-11-15 DIAGNOSIS — R7989 Other specified abnormal findings of blood chemistry: Secondary | ICD-10-CM

## 2023-11-15 DIAGNOSIS — Z0000 Encounter for general adult medical examination without abnormal findings: Secondary | ICD-10-CM

## 2023-11-15 DIAGNOSIS — M609 Myositis, unspecified: Secondary | ICD-10-CM | POA: Diagnosis not present

## 2023-11-15 NOTE — Progress Notes (Signed)
 Subjective:    Patient ID: Kendra Jacobson, female    DOB: 1980/08/07, 43 y.o.   MRN: [id number]  HPI  Patient has a history of lupus.  Last year she was having severe itching.  However biopsy showed no evidence of primary biliary cirrhosis.  Biopsy showed very little fibrosis.  Around the same time, her CK levels are elevated.  [facility] felt that the elevated LFT T's were more likely due to myositis.  Her most recent LFTs at the time she saw a gastroenterologist at [facility] were in the 30s.  Therefore no further workup was undertaken.  However the patient most recently had LFTs that were almost 400.  She also has persistent CK elevations.  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  She also has pain in discomfort in her quadriceps and hamstrings bilaterally.  She also reports a tremor and weakness in her upper extremities although her strength there is average on exam.  The concern is whether that her liver is inflamed due to fatty liver disease or whether it is inflamed due to underlying autoimmune disease.  She was recently switched to CellCept in June.  She has not had any follow-up lab work since that time.  Her mammogram, Pap smear, and colonoscopy are up-to-date.  She is due for Prevnar 20 due to the fact she is immunocompromise. Past Medical History:  Diagnosis Date   Complication of anesthesia    hallucinated with pain medicine after surgery   Depression    Hypertension    Lupus    Dr.[facility]   Past Surgical History:  Procedure Laterality Date   CESAREAN SECTION  04/2009   CESAREAN SECTION N/A 10/03/2014   Procedure: CESAREAN SECTION;  Surgeon: Rosaline Cobble, MD;  Location: [facility];  Service: Obstetrics;  Laterality: N/A;   MUSCLE BIOPSY Left 09/18/2012   Procedure: MUSCLE BIOPSY;  Surgeon: Redell Faith, DO;  Location: [facility];  Service: General;  Laterality: Left;   TONSILLECTOMY  1999   WISDOM TOOTH EXTRACTION  2000   x4    Current Outpatient Medications on File Prior to Visit  Medication Sig Dispense Refill   albuterol  (VENTOLIN  HFA) 108 (90 Base) MCG/ACT inhaler Inhale 2 puffs into the lungs every 6 (six) hours as needed for wheezing or shortness of breath. 8 g 0   corticotropin (ACTHAR) 80 UNIT/ML injectable gel Inject 80 Units into the muscle every other day.     fluticasone  (FLONASE ) 50 MCG/ACT nasal spray Place 2 sprays into both nostrils daily. 16 g 6   hydrochlorothiazide  (HYDRODIURIL ) 25 MG tablet TAKE 1 TABLET BY MOUTH EVERY DAY 90 tablet 1   hydroxychloroquine  (PLAQUENIL ) 200 MG tablet Take 400 mg by mouth every evening.      metoprolol  tartrate (LOPRESSOR ) 25 MG tablet Take 1 tablet (25 mg total) by mouth 2 (two) times daily. 180 tablet 1   mycophenolate (CELLCEPT) 500 MG tablet Take 1,000 mg by mouth 2 (two) times daily.     nitrofurantoin , macrocrystal-monohydrate, (MACROBID ) 100 MG capsule Take 100 mg by mouth daily.     norethindrone-ethinyl estradiol (AUROVELA 1/20) 1-20 MG-MCG tablet Take 1 tablet by mouth daily.     predniSONE  (DELTASONE ) 5 MG tablet Take 5 mg by mouth daily with breakfast.     No current facility-administered medications on file prior to visit.   Allergies  Allergen Reactions   Erythromycin Other (See Comments)    Upset stomach    Social History   Socioeconomic  History   Marital status: Married    Spouse name: Not on file   Number of children: Not on file   Years of education: Not on file   Highest education level: Bachelor's degree (e.g., BA, AB, BS)  Occupational History   Not on file  Tobacco Use   Smoking status: Never   Smokeless tobacco: Never  Vaping Use   Vaping status: Never Used  Substance and Sexual Activity   Alcohol use: No   Drug use: No   Sexual activity: Yes  Other Topics Concern   Not on file  Social History Narrative   Are you right handed or left handed? Right   Are you currently employed ? 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   Do you live at home alone?no   Who lives with you? Husband and 2 kids   What type of home do you live in: 1 story or 2 story? one    Caff.1 soda a day   Social Drivers of Corporate investment banker Strain: Low Risk  (11/14/2023)   Overall Financial Resource Strain (CARDIA)    Difficulty of Paying Living Expenses: Not very hard  Food Insecurity: No Food Insecurity (11/14/2023)   Hunger Vital Sign    Worried About Running Out of Food in the Last Year: Never true    Ran Out of Food in the Last Year: Never true  Transportation Needs: No Transportation Needs (11/14/2023)   PRAPARE - Administrator, Civil Service (Medical): No    Lack of Transportation (Non-Medical): No  Physical Activity: Insufficiently Active (11/14/2023)   Exercise Vital Sign    Days of Exercise per Week: 1 day    Minutes of Exercise per Session: 20 min  Stress: No Stress Concern Present (11/14/2023)   [employer] of Occupational Health - Occupational Stress Questionnaire    Feeling of Stress: Only a little  Social Connections: Socially Integrated (11/14/2023)   Social Connection and Isolation Panel    Frequency of Communication with Friends and Family: Three times a week    Frequency of Social Gatherings with Friends and Family: Once a week    Attends Religious Services: More than 4 times per year    Active Member of [employer] or Organizations: Yes    Attends Engineer, structural: More than 4 times per year    Marital Status: Married  Catering manager Violence: Not on file     Review of Systems  All other systems reviewed and are negative.      Objective:   Physical Exam Constitutional:      General: She is not in acute distress.    Appearance: Normal appearance. She is obese. She is not ill-appearing or toxic-appearing.  HENT:     Right Ear: Tympanic membrane and ear canal normal.     Left Ear: Tympanic membrane and ear canal normal.  Eyes:     Extraocular  Movements: Extraocular movements intact.     Conjunctiva/sclera: Conjunctivae normal.     Pupils: Pupils are equal, round, and reactive to light.  Cardiovascular:     Rate and Rhythm: Normal rate and regular rhythm.     Heart sounds: Normal heart sounds. No murmur heard.    No gallop.  Pulmonary:     Effort: Pulmonary effort is normal. No respiratory distress.     Breath sounds: Normal breath sounds. No stridor. No wheezing, rhonchi or rales.  Abdominal:  General: Bowel sounds are normal. There is no distension.     Palpations: Abdomen is soft.     Tenderness: There is no abdominal tenderness. There is no guarding or rebound.  Musculoskeletal:        General: No swelling or deformity.     Cervical back: Normal range of motion and neck supple. No rigidity or tenderness.     Right lower leg: No edema.     Left lower leg: No edema.  Lymphadenopathy:     Cervical: No cervical adenopathy.  Skin:    Coloration: Skin is not jaundiced.     Findings: No bruising, erythema or lesion.  Neurological:     General: No focal deficit present.     Mental Status: She is alert and oriented to person, place, and time. Mental status is at baseline.     Cranial Nerves: No cranial nerve deficit.     Motor: No weakness.     Gait: Gait normal.     Deep Tendon Reflexes: Reflexes normal.  Psychiatric:        Mood and Affect: Mood normal.        Behavior: Behavior normal.        Thought Content: Thought content normal.        Judgment: Judgment normal.     Patient has atrophy and weakness in her quadriceps and hamstrings bilaterally      Assessment & Plan:   Elevated liver function tests - Plan: CBC with Differential/Platelet, Comprehensive metabolic panel with GFR, Other/Misc lab test, Pneumococcal conjugate vaccine 20-valent (Prevnar 20)  Myositis, unspecified myositis type, unspecified site - Plan: CK, TSH, Pneumococcal conjugate vaccine 20-valent (Prevnar 20)  Need for vaccination - Plan:  Pneumococcal conjugate vaccine 20-valent (Prevnar 20)  General medical exam   Patient has lost almost 60 pounds.  I do not feel that her elevated liver function test are due to fatty liver disease.  I will check a fib 4 level.  If significantly elevated, I will obtain elastography and if she has increased risk of progression to cirrhosis consider adding Ozempic.  However I believe her elevated liver function tests are due to her underlying autoimmune condition/lupus.  If her CK level is elevated, I believe that this would confirm a that it is the autoimmune disease attacking both her liver and her muscles.  I will check a TSH to rule out hyperthyroidism given the muscle weakness and the weakness and tremor.  She received Prevnar 20.  Check CBC, CMP, TSH, CK level, fib-4.

## 2023-11-16 ENCOUNTER — Ambulatory Visit: Payer: Self-pay | Admitting: Family Medicine

## 2023-11-16 LAB — CBC WITH DIFFERENTIAL/PLATELET
Absolute Lymphocytes: 2269 {cells}/uL (ref 850–3900)
Absolute Monocytes: 1354 {cells}/uL — ABNORMAL HIGH (ref 200–950)
Basophils Absolute: 24 {cells}/uL (ref 0–200)
Basophils Relative: 0.2 %
Eosinophils Absolute: 24 {cells}/uL (ref 15–500)
Eosinophils Relative: 0.2 %
HCT: 43.3 % (ref 35.0–45.0)
Hemoglobin: 14.6 g/dL (ref 11.7–15.5)
MCH: 30.4 pg (ref 27.0–33.0)
MCHC: 33.7 g/dL (ref 32.0–36.0)
MCV: 90 fL (ref 80.0–100.0)
MPV: 8.9 fL (ref 7.5–12.5)
Monocytes Relative: 11.1 %
Neutro Abs: 8528 {cells}/uL — ABNORMAL HIGH (ref 1500–7800)
Neutrophils Relative %: 69.9 %
Platelets: 332 Thousand/uL (ref 140–400)
RBC: 4.81 Million/uL (ref 3.80–5.10)
RDW: 12.7 % (ref 11.0–15.0)
Total Lymphocyte: 18.6 %
WBC: 12.2 Thousand/uL — ABNORMAL HIGH (ref 3.8–10.8)

## 2023-11-16 LAB — COMPREHENSIVE METABOLIC PANEL WITH GFR
AG Ratio: 2.3 (calc) (ref 1.0–2.5)
ALT: 34 U/L — ABNORMAL HIGH (ref 6–29)
AST: 23 U/L (ref 10–30)
Albumin: 4.2 g/dL (ref 3.6–5.1)
Alkaline phosphatase (APISO): 37 U/L (ref 31–125)
BUN/Creatinine Ratio: 16 (calc) (ref 6–22)
BUN: 17 mg/dL (ref 7–25)
CO2: 32 mmol/L (ref 20–32)
Calcium: 9.4 mg/dL (ref 8.6–10.2)
Chloride: 102 mmol/L (ref 98–110)
Creat: 1.05 mg/dL — ABNORMAL HIGH (ref 0.50–0.99)
Globulin: 1.8 g/dL — ABNORMAL LOW (ref 1.9–3.7)
Glucose, Bld: 70 mg/dL (ref 65–99)
Potassium: 3.3 mmol/L — ABNORMAL LOW (ref 3.5–5.3)
Sodium: 142 mmol/L (ref 135–146)
Total Bilirubin: 0.6 mg/dL (ref 0.2–1.2)
Total Protein: 6 g/dL — ABNORMAL LOW (ref 6.1–8.1)
eGFR: 68 mL/min/1.73m2 (ref >=60)

## 2023-11-16 LAB — TSH: TSH: 2.79 m[IU]/L

## 2023-11-16 LAB — FIB-4 INDEX PANEL WITH REFLEX TO ENHANCED LIVER FIBROSIS (ELF) SCORE
ALT: 31 U/L — ABNORMAL HIGH (ref 6–29)
AST: 24 U/L (ref 10–30)
FIB 4 INDEX: 0.55
Platelets: 334 Thousand/uL (ref 140–400)

## 2023-11-16 LAB — CK: Total CK: 116 U/L (ref 20–239)

## 2024-01-10 NOTE — Therapy (Deleted)
 OUTPATIENT PHYSICAL THERAPY EVALUATION   Patient Name: Kendra Jacobson MRN: [id number] DOB:Jun 23, 1980, 43 y.o., female Today's Date: 01/10/2024  END OF SESSION:   Past Medical History:  Diagnosis Date   Complication of anesthesia    hallucinated with pain medicine after surgery   Depression    Hypertension    Lupus    Dr.[facility]   Past Surgical History:  Procedure Laterality Date   CESAREAN SECTION  04/2009   CESAREAN SECTION N/A 10/03/2014   Procedure: CESAREAN SECTION;  Surgeon: Rosaline Cobble, MD;  Location: [facility];  Service: Obstetrics;  Laterality: N/A;   MUSCLE BIOPSY Left 09/18/2012   Procedure: MUSCLE BIOPSY;  Surgeon: Redell Faith, DO;  Location: [facility];  Service: General;  Laterality: Left;   TONSILLECTOMY  1999   WISDOM TOOTH EXTRACTION  2000   x4   Patient Active Problem List   Diagnosis Date Noted   Long term current use of systemic steroids 07/06/2023   Vitamin D  deficiency 07/06/2023   Allergic rhinitis 07/27/2022   Vasomotor rhinitis 07/27/2022   Benign essential HTN 11/28/2021   Snoring 09/07/2020   S/P cesarean section 10/03/2014   Depression 04/23/2013   Lupus 04/23/2013   PCP: Duanne Butler DASEN, MD  REFERRING PROVIDER: Mai Lynwood FALCON, MD  REFERRING DIAG: Polymyositis, Muscle weakness  ONSET DATE: ***  THERAPY DIAG:  No diagnosis found.  Rationale for Evaluation and Treatment: Rehabilitation  SUBJECTIVE:                                                                                                                                                                                             SUBJECTIVE STATEMENT: ***  PERTINENT HISTORY: Patient is a 43 y.o. female who presents to outpatient physical therapy with a referral for medical diagnosis polymyositis, muscle weakness. This patient's chief complaints consist of ***, leading to the following functional deficits: ***. Relevant past medical history and comorbidities  include the following: she has Depression; Lupus; S/P cesarean section; Snoring; Benign essential HTN; Allergic rhinitis; Vasomotor rhinitis; Long term current use of systemic steroids; and Vitamin D  deficiency on their problem list. she  has a past medical history of Complication of anesthesia, Depression, Hypertension, and Lupus. she  has a past surgical history that includes Tonsillectomy (1999); Cesarean section (04/2009); Wisdom tooth extraction (2000); Muscle biopsy (Left, 09/18/2012); and Cesarean section (N/A, 10/03/2014). Patient denies hx of {redflags:27294}  Exercise history: ***  PAIN:  Are you having pain? {OPRCPAIN:27236}  PRECAUTIONS: {Therapy precautions:24002}  RED FLAGS: {PT Red Flags:29287}   WEIGHT BEARING RESTRICTIONS: {Yes ***/No:24003}  FALLS: Has patient fallen in last 6 months? No  LIVING ENVIRONMENT: Lives with: {OPRC lives with:25569::lives with their family} Lives in: {Lives in:25570} Stairs: {opstairs:27293} Has following equipment at home: {Assistive devices:23999}  PLOF: {PLOF:24004}  PATIENT GOALS: ***  OBJECTIVE:  Note: Objective measures were completed at Evaluation unless otherwise noted.  COGNITION: Overall cognitive status: {cognition:24006}   DTRs:  Biceps {DTR:24024}, Brachioradialis {DTR:24024}, Triceps {DTR:24024}, Patella {DTR:24024}, and Achilles {DTR:24024}  POSTURE: {posture:25561}  LOWER EXTREMITY ROM:     {AROM/PROM:27142}  Right Eval Left Eval  Hip flexion    Hip extension    Hip abduction    Hip adduction    Hip internal rotation    Hip external rotation    Knee flexion    Knee extension    Ankle dorsiflexion    Ankle plantarflexion    Ankle inversion    Ankle eversion     (Blank rows = not tested)  LOWER EXTREMITY MMT:    MMT Right Eval Left Eval  Hip flexion    Hip extension    Hip abduction    Hip adduction    Hip internal rotation    Hip external rotation    Knee flexion    Knee extension    Ankle  dorsiflexion    Ankle plantarflexion    Ankle inversion    Ankle eversion    (Blank rows = not tested)  UPPER EXTREMITY ROM:     {AROM/PROM:27142}  Right Eval Left Eval  Shoulder flexion    Shoulder extension    Shoulder abduction    Shoulder internal rotation    Shoulder external rotation    Elbow flexion    Elbow extension    Wrist Flexion    Wrist Extension     (Blank rows = not tested)  UPPER EXTREMITY MMT:     MMT  Right Eval Left Eval  Shoulder flexion    Shoulder extension    Shoulder abduction    Shoulder internal rotation    Shoulder external rotation    Elbow flexion    Elbow extension    Wrist Flexion    Wrist Extension     (Blank rows = not tested)  GAIT: Findings: Gait Characteristics: {gait characteristics:25376}, Distance walked: ***, Assistive device utilized:{Assistive devices:23999}, Level of assistance: {Levels of assistance:24026}, and Comments: ***  FUNCTIONAL/BALANCE TESTS  5 Times Sit To Stand Test (5TSTS): Purpose: to assess dynamic balance, sit <> stand transfer, and functional LE strength/power for functional mobility.  Results: *** seconds with *** UE from *** surface.   *** Analysis: patient completed test in *** their long term goal of *** seconds, which is the age matched norm for a healthy community dwelling adult ***.   6-Minute Walk Test ( ): Purpose: to assess function with community mobility Results: *** feet with ***.  *** Analysis: patient ambulated *** their long term goal of *** feet, which is the age matched norm for a healthy community dwelling adult ***.   PATIENT SURVEYS:  {rehab surveys:24030}  TREATMENT : ***    PATIENT EDUCATION: Education details: Exercise purpose/form. Self management techniques. Education on diagnosis, prognosis, POC, anatomy and physiology of current condition.  Education on HEP including handout. Person educated: Patient Education method: Explanation, Demonstration, Tactile cues, Verbal cues, and Handouts Education comprehension: verbalized understanding, returned demonstration, verbal cues required, tactile cues required, and needs further education  HOME EXERCISE PROGRAM: ***  GOALS: Goals reviewed with patient? No  SHORT TERM GOALS: Target date: 01/24/2024  Patient will be independent with initial home exercise program for self-management of symptoms. Baseline: {HEPbaseline4:27310} (01/10/24); Goal status: INITIAL  LONG TERM GOALS: Target date: 04/03/2024  Patient will be independent with a long-term home exercise program for self-management of symptoms.  Baseline: {HEPbaseline4:27310} (01/10/24); Goal status: INITIAL  2.  Patient will demonstrate improved {SarasLTGPRO:32233} to demonstrate improvement in overall condition and self-reported functional ability.  Baseline: {Sarasgoalbaseline:32234} (01/10/24); Goal status: INITIAL  3.  *** Baseline: {Sarasgoalbaseline:32234} (01/10/24); Goal status: INITIAL  4.  *** Baseline: {Sarasgoalbaseline:32234} (01/10/24); Goal status: INITIAL  5.  Patient will demonstrate improvement in Patient Specific Functional Scale (PSFS) of equal or greater than 8/10 points to reflect clinically significant improvement in patient's most valued functional activities. Baseline: {Sarasgoalbaseline:32234} (01/10/24); Goal status: INITIAL  6.  Patient will report NPRS equal or less than 3/10 during functional activities during the last 2 weeks to improve their abilitly to complete community, work and/or recreational activities with less limitation. Baseline: ***/10 (01/10/24); Goal status: INITIAL  ASSESSMENT:  CLINICAL IMPRESSION: Patient is a 43 y.o. female referred to outpatient physical therapy with a medical diagnosis of *** who presents with signs and symptoms consistent with ***. Patient  presents with significant *** impairments that are limiting ability to complete *** without difficulty. Patient will benefit from skilled physical therapy intervention to address current body structure impairments and activity limitations to improve function and work towards goals set in current POC in order to return to prior level of function or maximal functional improvement.   Mechanical sensitivities: ***   OBJECTIVE IMPAIRMENTS: {opptimpairments:25111}.   ACTIVITY LIMITATIONS: {activitylimitations:27494}  PARTICIPATION LIMITATIONS: {participationrestrictions:25113}  PERSONAL FACTORS: {Personal factors:25162} are also affecting patient's functional outcome.   REHAB POTENTIAL: {rehabpotential:25112}  CLINICAL DECISION MAKING: {clinical decision making:25114}  EVALUATION COMPLEXITY: {Evaluation complexity:25115}  PLAN:  PT FREQUENCY: 2x/week  PT DURATION: 6 months  PLANNED INTERVENTIONS: 97164- PT Re-evaluation, 97750- Physical Performance Testing, 97110-Therapeutic exercises, 97530- Therapeutic activity, [id number]- Neuromuscular re-education, 97535- Self Care, 02859- Manual therapy, [phone]- Gait training, [phone]- Electrical stimulation (unattended), Patient/Family education, Balance training, Cryotherapy, and Moist heat  PLAN FOR NEXT SESSION: ***  Vernell Rise, SPT 01/10/24  [facility] [address] [place], [place] [address] P: [phone] I F: [phone]

## 2024-01-14 ENCOUNTER — Ambulatory Visit: Admitting: Physical Therapy

## 2024-01-14 ENCOUNTER — Telehealth: Admitting: Physician Assistant

## 2024-01-14 DIAGNOSIS — H1031 Unspecified acute conjunctivitis, right eye: Secondary | ICD-10-CM

## 2024-01-14 MED ORDER — OFLOXACIN 0.3 % OP SOLN: 1.0000 [drp] | Freq: Four times a day (QID) | OPHTHALMIC | 0 refills | Status: AC

## 2024-01-14 NOTE — Progress Notes (Signed)
 We are sorry that you are not feeling well.  Here is how we plan to help!  Based on what you have shared with me it looks like you have conjunctivitis.  Conjunctivitis is a common inflammatory or infectious condition of the eye that is often referred to as pink eye.  In most cases it is contagious (viral or bacterial). However, not all conjunctivitis requires antibiotics (ex. Allergic).  We have made appropriate suggestions for you based upon your presentation.  I have prescribed Oflaxacin 1-2 drops 4 times a day times 5 days   Pink eye can be highly contagious.  It is typically spread through direct contact with secretions, or contaminated objects or surfaces that one may have touched.  Strict handwashing is suggested with soap and water is urged.  If not available, use alcohol based had sanitizer.  Avoid unnecessary touching of the eye.  If you wear contact lenses, you will need to refrain from wearing them until you see no white discharge from the eye for at least 24 hours after being on medication.  You should see symptom improvement in 1-2 days after starting the medication regimen.  Call us  if symptoms are not improved in 1-2 days.  Home Care: Wash your hands often! Do not wear your contacts until you complete your treatment plan. Avoid sharing towels, bed linen, personal items with a person who has pink eye. See attention for anyone in your home with similar symptoms.  Get Help Right Away If: Your symptoms do not improve. You develop blurred or loss of vision. Your symptoms worsen (increased discharge, pain or redness)  Your e-visit answers were reviewed by a board certified advanced clinical practitioner to complete your personal care plan.  Depending on the condition, your plan could have included both over the counter or prescription medications.  If there is a problem please reply  once you have received a response from your provider.  Your safety is important to us .  If you have  drug allergies check your prescription carefully.    You can use MyChart to ask questions about today's visit, request a non-urgent call back, or ask for a work or school excuse for 24 hours related to this e-Visit. If it has been greater than 24 hours you will need to follow up with your provider, or enter a new e-Visit to address those concerns.   You will get an e-mail in the next two days asking about your experience.  I hope that your e-visit has been valuable and will speed your recovery. Thank you for using e-visits.  I have spent 5 minutes in review of e-visit questionnaire, review and updating patient chart, medical decision making and response to patient.   Elsie Velma Lunger, PA-C

## 2024-01-16 ENCOUNTER — Ambulatory Visit: Admitting: Physical Therapy

## 2024-01-21 ENCOUNTER — Ambulatory Visit: Admitting: Physical Therapy

## 2024-01-23 ENCOUNTER — Ambulatory Visit: Admitting: Physical Therapy

## 2024-01-28 ENCOUNTER — Ambulatory Visit: Admitting: Physical Therapy

## 2024-02-04 ENCOUNTER — Ambulatory Visit: Admitting: Physical Therapy

## 2024-02-06 ENCOUNTER — Ambulatory Visit: Admitting: Physical Therapy

## 2024-02-11 ENCOUNTER — Ambulatory Visit: Admitting: Physical Therapy

## 2024-02-13 ENCOUNTER — Ambulatory Visit: Admitting: Physical Therapy

## 2024-02-18 ENCOUNTER — Ambulatory Visit: Admitting: Physical Therapy

## 2024-02-20 ENCOUNTER — Ambulatory Visit: Admitting: Physical Therapy

## 2024-02-25 ENCOUNTER — Ambulatory Visit: Admitting: Physical Therapy

## 2024-02-28 ENCOUNTER — Ambulatory Visit: Admitting: Physical Therapy

## 2024-03-03 ENCOUNTER — Ambulatory Visit: Admitting: Physical Therapy

## 2024-03-05 ENCOUNTER — Ambulatory Visit: Admitting: Physical Therapy

## 2024-11-17 ENCOUNTER — Encounter: Admitting: Family Medicine
